# Patient Record
Sex: Female | Born: 1955 | Race: Black or African American | Hispanic: No | Marital: Single | Smoking: Never smoker
Health system: Southern US, Community
[De-identification: ages and names within clinical notes are randomized; demographics above are authoritative.]

## PROBLEM LIST (undated history)

## (undated) DIAGNOSIS — Z8709 Personal history of other diseases of the respiratory system: Secondary | ICD-10-CM

## (undated) DIAGNOSIS — M8580 Other specified disorders of bone density and structure, unspecified site: Secondary | ICD-10-CM

## (undated) DIAGNOSIS — Z8489 Family history of other specified conditions: Secondary | ICD-10-CM

## (undated) DIAGNOSIS — Z8701 Personal history of pneumonia (recurrent): Secondary | ICD-10-CM

## (undated) DIAGNOSIS — I10 Essential (primary) hypertension: Secondary | ICD-10-CM

## (undated) DIAGNOSIS — R112 Nausea with vomiting, unspecified: Secondary | ICD-10-CM

## (undated) DIAGNOSIS — R002 Palpitations: Secondary | ICD-10-CM

## (undated) DIAGNOSIS — Z780 Asymptomatic menopausal state: Secondary | ICD-10-CM

## (undated) DIAGNOSIS — M25519 Pain in unspecified shoulder: Secondary | ICD-10-CM

## (undated) DIAGNOSIS — E119 Type 2 diabetes mellitus without complications: Secondary | ICD-10-CM

## (undated) DIAGNOSIS — J45909 Unspecified asthma, uncomplicated: Secondary | ICD-10-CM

## (undated) DIAGNOSIS — E785 Hyperlipidemia, unspecified: Secondary | ICD-10-CM

## (undated) DIAGNOSIS — R079 Chest pain, unspecified: Secondary | ICD-10-CM

## (undated) DIAGNOSIS — M1990 Unspecified osteoarthritis, unspecified site: Secondary | ICD-10-CM

## (undated) DIAGNOSIS — M549 Dorsalgia, unspecified: Secondary | ICD-10-CM

## (undated) DIAGNOSIS — Z9889 Other specified postprocedural states: Secondary | ICD-10-CM

## (undated) DIAGNOSIS — F419 Anxiety disorder, unspecified: Secondary | ICD-10-CM

## (undated) DIAGNOSIS — J302 Other seasonal allergic rhinitis: Secondary | ICD-10-CM

## (undated) HISTORY — PX: HAMMER TOE SURGERY: SHX385

## (undated) HISTORY — DX: Pain in unspecified shoulder: M25.519

## (undated) HISTORY — DX: Chest pain, unspecified: R07.9

## (undated) HISTORY — DX: Essential (primary) hypertension: I10

## (undated) HISTORY — PX: OTHER SURGICAL HISTORY: SHX169

## (undated) HISTORY — DX: Other specified disorders of bone density and structure, unspecified site: M85.80

## (undated) HISTORY — DX: Dorsalgia, unspecified: M54.9

## (undated) HISTORY — DX: Type 2 diabetes mellitus without complications: E11.9

## (undated) HISTORY — PX: ROTATOR CUFF REPAIR: SHX139

## (undated) HISTORY — DX: Hyperlipidemia, unspecified: E78.5

## (undated) HISTORY — DX: Asymptomatic menopausal state: Z78.0

## (undated) HISTORY — DX: Anxiety disorder, unspecified: F41.9

---

## 1999-01-23 ENCOUNTER — Other Ambulatory Visit: Admission: RE | Admit: 1999-01-23 | Discharge: 1999-01-23 | Payer: Self-pay | Admitting: Obstetrics and Gynecology

## 1999-02-15 ENCOUNTER — Ambulatory Visit (HOSPITAL_COMMUNITY): Admission: RE | Admit: 1999-02-15 | Discharge: 1999-02-15 | Payer: Self-pay | Admitting: Gastroenterology

## 1999-05-07 HISTORY — PX: ABDOMINAL HYSTERECTOMY: SHX81

## 2000-02-06 ENCOUNTER — Encounter: Payer: Self-pay | Admitting: Emergency Medicine

## 2000-02-06 ENCOUNTER — Encounter: Admission: RE | Admit: 2000-02-06 | Discharge: 2000-02-06 | Payer: Self-pay | Admitting: Emergency Medicine

## 2000-04-08 ENCOUNTER — Other Ambulatory Visit: Admission: RE | Admit: 2000-04-08 | Discharge: 2000-04-08 | Payer: Self-pay | Admitting: Obstetrics and Gynecology

## 2000-06-06 ENCOUNTER — Emergency Department (HOSPITAL_COMMUNITY): Admission: EM | Admit: 2000-06-06 | Discharge: 2000-06-07 | Payer: Self-pay | Admitting: Emergency Medicine

## 2000-08-05 ENCOUNTER — Inpatient Hospital Stay (HOSPITAL_COMMUNITY): Admission: RE | Admit: 2000-08-05 | Discharge: 2000-08-08 | Payer: Self-pay | Admitting: Obstetrics and Gynecology

## 2000-08-05 ENCOUNTER — Encounter (INDEPENDENT_AMBULATORY_CARE_PROVIDER_SITE_OTHER): Payer: Self-pay | Admitting: Specialist

## 2001-04-06 ENCOUNTER — Encounter: Admission: RE | Admit: 2001-04-06 | Discharge: 2001-04-06 | Payer: Self-pay | Admitting: Emergency Medicine

## 2001-04-06 ENCOUNTER — Encounter: Payer: Self-pay | Admitting: Emergency Medicine

## 2001-05-11 ENCOUNTER — Other Ambulatory Visit: Admission: RE | Admit: 2001-05-11 | Discharge: 2001-05-11 | Payer: Self-pay | Admitting: Obstetrics and Gynecology

## 2002-04-22 ENCOUNTER — Encounter: Admission: RE | Admit: 2002-04-22 | Discharge: 2002-04-22 | Payer: Self-pay | Admitting: Emergency Medicine

## 2002-04-22 ENCOUNTER — Encounter: Payer: Self-pay | Admitting: Emergency Medicine

## 2002-05-25 ENCOUNTER — Other Ambulatory Visit: Admission: RE | Admit: 2002-05-25 | Discharge: 2002-05-25 | Payer: Self-pay | Admitting: Obstetrics and Gynecology

## 2002-08-03 ENCOUNTER — Encounter: Admission: RE | Admit: 2002-08-03 | Discharge: 2002-08-03 | Payer: Self-pay | Admitting: Emergency Medicine

## 2002-08-03 ENCOUNTER — Encounter: Payer: Self-pay | Admitting: Emergency Medicine

## 2002-08-09 ENCOUNTER — Ambulatory Visit (HOSPITAL_COMMUNITY): Admission: RE | Admit: 2002-08-09 | Discharge: 2002-08-09 | Payer: Self-pay | Admitting: Obstetrics and Gynecology

## 2003-05-24 ENCOUNTER — Encounter: Admission: RE | Admit: 2003-05-24 | Discharge: 2003-05-24 | Payer: Self-pay | Admitting: Emergency Medicine

## 2003-06-02 ENCOUNTER — Other Ambulatory Visit: Admission: RE | Admit: 2003-06-02 | Discharge: 2003-06-02 | Payer: Self-pay | Admitting: Obstetrics and Gynecology

## 2003-08-11 ENCOUNTER — Encounter: Admission: RE | Admit: 2003-08-11 | Discharge: 2003-08-11 | Payer: Self-pay | Admitting: Emergency Medicine

## 2004-06-13 ENCOUNTER — Encounter: Admission: RE | Admit: 2004-06-13 | Discharge: 2004-06-13 | Payer: Self-pay | Admitting: Emergency Medicine

## 2004-09-18 ENCOUNTER — Encounter: Admission: RE | Admit: 2004-09-18 | Discharge: 2004-09-18 | Payer: Self-pay | Admitting: Emergency Medicine

## 2005-02-01 ENCOUNTER — Other Ambulatory Visit: Admission: RE | Admit: 2005-02-01 | Discharge: 2005-02-01 | Payer: Self-pay | Admitting: Obstetrics and Gynecology

## 2005-08-08 ENCOUNTER — Encounter: Admission: RE | Admit: 2005-08-08 | Discharge: 2005-08-08 | Payer: Self-pay | Admitting: Emergency Medicine

## 2005-08-29 ENCOUNTER — Encounter (INDEPENDENT_AMBULATORY_CARE_PROVIDER_SITE_OTHER): Payer: Self-pay | Admitting: Specialist

## 2005-08-29 ENCOUNTER — Ambulatory Visit (HOSPITAL_BASED_OUTPATIENT_CLINIC_OR_DEPARTMENT_OTHER): Admission: RE | Admit: 2005-08-29 | Discharge: 2005-08-29 | Payer: Self-pay | Admitting: Orthopedic Surgery

## 2006-08-11 ENCOUNTER — Encounter: Admission: RE | Admit: 2006-08-11 | Discharge: 2006-08-11 | Payer: Self-pay | Admitting: Emergency Medicine

## 2007-02-11 ENCOUNTER — Encounter: Admission: RE | Admit: 2007-02-11 | Discharge: 2007-02-11 | Payer: Self-pay | Admitting: Emergency Medicine

## 2007-03-24 ENCOUNTER — Other Ambulatory Visit: Admission: RE | Admit: 2007-03-24 | Discharge: 2007-03-24 | Payer: Self-pay | Admitting: Obstetrics and Gynecology

## 2007-05-07 DIAGNOSIS — M25519 Pain in unspecified shoulder: Secondary | ICD-10-CM

## 2007-05-07 HISTORY — DX: Pain in unspecified shoulder: M25.519

## 2007-07-01 ENCOUNTER — Encounter: Admission: RE | Admit: 2007-07-01 | Discharge: 2007-07-01 | Payer: Self-pay | Admitting: Emergency Medicine

## 2007-07-13 ENCOUNTER — Encounter: Admission: RE | Admit: 2007-07-13 | Discharge: 2007-07-13 | Payer: Self-pay | Admitting: Emergency Medicine

## 2007-09-17 ENCOUNTER — Encounter: Admission: RE | Admit: 2007-09-17 | Discharge: 2007-09-17 | Payer: Self-pay | Admitting: Emergency Medicine

## 2008-02-03 ENCOUNTER — Encounter: Admission: RE | Admit: 2008-02-03 | Discharge: 2008-02-03 | Payer: Self-pay | Admitting: Emergency Medicine

## 2008-09-19 ENCOUNTER — Encounter: Admission: RE | Admit: 2008-09-19 | Discharge: 2008-09-19 | Payer: Self-pay | Admitting: Internal Medicine

## 2009-05-06 DIAGNOSIS — R079 Chest pain, unspecified: Secondary | ICD-10-CM

## 2009-05-06 HISTORY — DX: Chest pain, unspecified: R07.9

## 2009-09-25 ENCOUNTER — Encounter: Admission: RE | Admit: 2009-09-25 | Discharge: 2009-09-25 | Payer: Self-pay | Admitting: Internal Medicine

## 2009-09-29 ENCOUNTER — Encounter: Admission: RE | Admit: 2009-09-29 | Discharge: 2009-09-29 | Payer: Self-pay | Admitting: Internal Medicine

## 2010-04-02 ENCOUNTER — Encounter: Admission: RE | Admit: 2010-04-02 | Discharge: 2010-04-02 | Payer: Self-pay | Admitting: Internal Medicine

## 2010-09-21 NOTE — Op Note (Signed)
Michelle Oneill, Michelle Oneill                ACCOUNT NO.:  [id number]   MEDICAL RECORD NO.:  [id number]          PATIENT TYPE:  AMB   LOCATION:  DSC                          FACILITY:  [facility]   PHYSICIAN:  Cindee Salt, M.D.       DATE OF BIRTH:  Apr 16, 1956   DATE OF PROCEDURE:  08/29/2005  DATE OF DISCHARGE:                                 OPERATIVE REPORT   PREOPERATIVE DIAGNOSIS:  Mucoid cyst, right thumb.   POSTOPERATIVE DIAGNOSIS:  Mucoid cyst, right thumb.   OPERATION:  Excision of mucoid cyst, debridement of interphalangeal joint,  right thumb.   SURGEON:  Cindee Salt, M.D.   ASSISTANT:  Carolyne Fiscal R.N.   ANESTHESIA:  Forearm-based IV regional.   HISTORY:  The patient is a 55 year old female with history of a mass at the  PIP joint of her right thumb indicative of a mucoid cyst.  She is desirous  of removing.  X-rays revealed degenerative changes.  She is aware of risks  and complications.  Preoperatively questions were answered, the thumb marked  by the patient and surgeon.   PROCEDURE:  The patient was brought to the operating room, where a forearm-  based IV regional anesthetic was carried out without difficulty.  She was  prepped using DuraPrep, supine position, right arm free.  A curvilinear  incision made transversely over the IP joint of the thumb, carried down  through subcutaneous tissue.  Bleeders were electrocauterized.  The cyst was  immediately encountered.  With blunt sharp dissection this was dissected  free.  A small dorsal sensory nerve proceeded through the cyst and was  unable to be dissected free due to the amount of scarring.  This was  resected.  The cyst was then removed, the joint opened.  Degenerative  changes were then removed with a small rongeur from both the distal phalanx  and proximal phalanx.  The wound was copiously irrigated with saline.  The  skin was then closed with interrupted 5-0 nylon sutures.  The specimen was  sent to pathology.  A sterile  compressive dressing and splint to the thumb  applied.  The patient tolerated the procedure well and was taken to the  recovery room for observation in satisfactory condition.  She is discharged  home to return to the [facility] [place] in one week on Vicodin.           ______________________________  Cindee Salt, M.D.     GK/MEDQ  D:  08/29/2005  T:  08/30/2005  Job:  [id number]

## 2010-09-21 NOTE — Op Note (Signed)
NAME:  Michelle Oneill, Michelle Oneill                          ACCOUNT NO.:  [id number]   MEDICAL RECORD NO.:  [id number]                   PATIENT TYPE:  AMB   LOCATION:  SDC                                  FACILITY[facility]  [facility]   PHYSICIAN:  James A. Ashley Royalty, M.D.             DATE OF BIRTH:  Mar 29, 1956   DATE OF PROCEDURE:  08/09/2002  DATE OF DISCHARGE:  08/09/2002                                 OPERATIVE REPORT   PREOPERATIVE DIAGNOSES:  1. Pelvic pain.  2. Status post hysterectomy and tubal sterilization procedure.   POSTOPERATIVE DIAGNOSES:  1. Pelvic adhesions.     a. Small bowel to right pelvic side wall.     b. Omentum to anterior abdominal wall.   PROCEDURE:  1. Diagnostic/operative laparoscopy.  2. Lysis of adhesions.   SURGEON:  Rudy Jew. Ashley Royalty, M.D.   ASSISTANT:  Velora Mediate, OB/GYN N.P.   ANESTHESIA:  General.   ESTIMATED BLOOD LOSS:  Less than 25 mL.   COMPLICATIONS:  None.   PACKS AND DRAINS:  None.   PROCEDURE:  The patient was taken to the operating room and placed in the  dorsal supine position.  After adequate general anesthesia was administered  she was placed in the lithotomy position and prepped and draped in the usual  manner for abdominal and vaginal surgery.  The bladder was drained with a  red rubber catheter.  A 1.5 cm infraumbilical incision was made in the  transverse plane.  A size 10-11 disposable laparoscopic trocar was then  placed in the abdominal cavity.  Its location was verified by placement of  the laparoscope.  There was no evidence of any trauma.  CO2 was used to  create a pneumoperitoneum which was maintained throughout the procedure.  Next, 5 mm suprapubic trocars were placed in the left and right lower  quadrants, respectively.  Transillumination, direct visualization techniques  were employed.  The pelvis was then thoroughly surveyed.  The uterus was  noted to be surgically absent.  The left ovary was adherent to the left  pelvic side  wall.  The left fallopian tube was not visualized.  It was noted  that the patient underwent a bipolar cautery on that tube to accomplish  sterilization previously.  The right fallopian tube and ovary were adherent  to the right pelvic side wall.  It was noted that the sterilization  procedure performed on the right fallopian tube was a Falope ring instead of  bipolar cautery, thus explaining the larger presence of a right fallopian  tube as opposed to the essential non-existence of the left fallopian tube.  Additional survey of the pelvis revealed numerous loops of small bowel  adherent to the right pelvic side wall.  In addition, the omentum was noted  to be adherent to the anterior abdominal wall.  Some of the adhesions were  in close proximity to the umbilical trocar site.  Careful  examination of  these adhesions including through a 5 mm camera through the inferior port  reveal no evidence of any injury or bowel adherent to the anterior abdominal  wall.  Appropriate photos were obtained.   Using the laser at 14 watts power with GRP-6 tip the adhesions from the  small bowel to the right aspect of the pelvic side wall were released.  Appropriate photos were obtained.  Next, an attempt was made to release as  many of the omental adhesions from the anterior abdominal wall as possible.  In order to accomplish this the tripolar cautery was employed and careful  attention to avoiding injury to the small bowel.  In the final analysis  some, but not all of the adhesions of the omentum to the anterior abdominal  wall were lysed.  The remainder were felt to be too difficult to accomplish  laparoscopically and would undoubtedly require laparotomy.  Since the  patient's pain was noted in her pelvis it was unclear whether she would  benefit at all from further lysis of adhesions and therefore no further  attempts were made.   At this point the patient was felt to have benefitted maximally from  the  surgical procedure.  The abdominal instruments were then removed and  pneumoperitoneum evacuated.  Fascial defects were closed with 0 Vicryl in an  interrupted fashion.  The skin was closed with 3-0 chromic or equivalent in  a subcuticular fashion.  Approximately 10 mL of 0.25% bupivacaine was  instilled into the incisions to aid in postoperative analgesia.   The patient tolerated the procedure extremely well and was returned to the  recovery room in good condition.                                               James A. Ashley Royalty, M.D.    JAM/MEDQ  D:  08/11/2002  T:  08/11/2002  Job:  [id number]

## 2010-09-21 NOTE — H&P (Signed)
NAME:  Michelle Oneill, Michelle Oneill                          ACCOUNT NO.:  [id number]   MEDICAL RECORD NO.:  [id number]                   PATIENT TYPE:  AMB   LOCATION:  SDC                                  [facility]ILITY[facility]   PHYSICIAN:  James A. Ashley Royalty, M.D.             DATE OF BIRTH:  [birth year]   DATE OF ADMISSION:  08/09/2002  DATE OF DISCHARGE:                                HISTORY & PHYSICAL   HISTORY OF PRESENT ILLNESS:  The patient is a 55 year old gravida 4, para 2,  AB 2 who presented to me 07/01/2002 complaining of left lower quadrant  discomfort, intermittent, lasting 15-30 minutes at a time.  At times it is  debilitating enough to require her to stay in bed.  Ultrasound was performed  07/15/2002 which revealed two cysts on the left adnexa, the greater of which  was 1.6 cm in greatest diameter.  The right adnexa was unremarkable.  The  uterus is surgically absent.  She was offered GI consultation prior to any  surgical intervention.  However, instead, she stated her symptoms were  sufficiently debilitating to warrant surgical intervention and preferred to  proceed with laparoscopy instead.   MEDICATIONS:  Rhinocort, albuterol, Biaxin, alprazolam.   PAST MEDICAL HISTORY:  1. Bronchitis.  2. Hypercholesterolemia for which she sees Dr. Lorenz Coaster.   PAST SURGICAL HISTORY:  Status post C-section x2, laparoscopic bilateral  tubal sterilization procedure, total abdominal hysterectomy.   ALLERGIES:  ZYRTEC.   FAMILY HISTORY:  Family history is positive for colon cancer and diabetes.   SOCIAL HISTORY:  The patient denies use of tobacco or consuming alcohol.   REVIEW OF SYSTEMS:  Noncontributory.   PHYSICAL EXAMINATION:  GENERAL:  Well-developed, well-nourished pleasant  black female in no acute distress.  VITAL SIGNS:  Afebrile, vital signs stable.  SKIN:  Warm and dry without lesions.  LYMPHATICS:  There is no supraclavicular, cervical, or inguinal adenopathy.  HEENT:   Normocephalic.  NECK:  Supple without thyromegaly.  CHEST:  Lungs are clear.  CARDIAC:  Regular rate and rhythm without murmurs, gallops, or rubs.  BREAST:  Exam deferred.  ABDOMEN:  Soft and nontender without masses or organomegaly.  Bowel sounds  are active.  MUSCULOSKELETAL:  Full range of motion without edema, cyanosis, or CVA  tenderness.  PELVIC:  Deferred until examination under anesthesia.    IMPRESSION:  1. Lower abdominal discomfort - left greater than right - etiology     uncertain.  Differential includes adhesions, endometriosis, primary     gastrointestinal, etc.  2. Status post total abdominal hysterectomy.  3. Hypercholesterolemia.  4. Status post laparoscopic bilateral tubal sterilization procedure.  5. Left adnexal cyst versus cystic follicle.   PLAN:  Diagnostic/operative laparoscopy.  Risks, benefits, complications,  and alternatives fully discussed with the patient.  Possibility of  unilateral/bilateral salpingo-oophorectomy was discussed and accepted.  Possibility of exploratory laparotomy discussed and accepted.  Questions  invited/answered.                                               James A. Ashley Royalty, M.D.    JAM/MEDQ  D:  08/08/2002  T:  08/09/2002  Job:  [id number]

## 2010-09-21 NOTE — Discharge Summary (Signed)
[facility]  Patient:    Michelle Oneill, Michelle Oneill                       MRN: [id number] Adm. Date:  [id number] Disc. Date: [id number] Attending:  Wandalee Ferdinand                           Discharge Summary  DISCHARGE DIAGNOSES:          1. Fibroid uterus.                               2. Benign proliferative endometrium.  DISCHARGE MEDICATIONS:        Analgesics, iron.  HISTORY AND PHYSICAL:         This is a 55 year old gravida 4, para 2, AB2 for hysterectomy pursuant to the history of a symptomatic fibroid uterus with lower abdominal discomfort, dysmenorrhea, dyspareunia.  HOSPITAL COURSE:              Patient was admitted to [facility] [place].  On August 07, 2000 she was taken to the operating room and underwent total abdominal hysterectomy per Dr. Sylvester Harder.  There were no intraoperative complications.  Patients postoperative course was benign.  She was discharged home on the third postoperative day afebrile and in satisfactory condition.  LABORATORIES:                 Hemoglobin and hematocrit on admission were 9.1 and 25.8 respectively.  Repeat values were obtained throughout the hospitalization the last of which were obtained August 08, 2000 and revealed values of 9.8 and 27.9 respectively.  Routine chemistries were within normal limits.  Serum pregnancy test was negative.  Urinalysis was negative.  Type and Rh were obtained, but equivocal in the chart.  DISPOSITION:                  Patient is to return to [facility] in four to six weeks for postoperative evaluation. DD:  08/25/00 TD:  08/25/00 Job: [id number]

## 2010-09-21 NOTE — Op Note (Signed)
[facility]  Patient:    Michelle Oneill, Michelle Oneill                         MRN: [id number] Proc. Date: 08/05/00 Attending:  Fayrene Fearing A. Ashley Royalty, M.D.                           Operative Report  PREOPERATIVE DIAGNOSES:       1. Fibroid uterus.                               2. Lower abdominal discomfort, dysmenorrhea,                                  and dyspareunia - unresponsive to medical                                  management.  POSTOPERATIVE DIAGNOSES:      1. Fibroid uterus.                               2. Lower abdominal discomfort, dysmenorrhea,                                  and dyspareunia - unresponsive to medical                                  management, pathology pending.  OPERATION:                    Total abdominal hysterectomy.  SURGEON:                      Rudy Jew. Ashley Royalty, M.D.  ASSISTANT:                    Georgina Peer, M.D.  ANESTHESIA:                   General.  ESTIMATED BLOOD LOSS:         300 cc.  COMPLICATIONS:                None.  PACKS AND DRAINS:             Foley.  Sponge, needle, and instrument count                               was reported as correct x 2.  DESCRIPTION OF PROCEDURE:     The patient was taken to the operating room and placed in the dorsal supine position.  After adequate general anesthesia was administered, she was prepped and draped in the usual manner for abdominal surgery.  The vagina was prepped as well.  A Foley catheter was placed.  A Pfannenstiel incision was made down to the level of the fascia.  The old skin incision from her C-section was utilized for this purpose.  The fascia was opened with sharp dissection and the incision extended transversely.  The rectus  muscles were separated from the overlying fascia using sharp and blunt dissection.  The patient was noted to be quite scarred, apparently from her previous abdominal surgery.  The peritoneum was elevated and  entered atraumatically with Metzenbaum scissors.  The incision was extended longitudinally.  There was a single omental adhesion to the anterior abdominal wall which was divided and secured with free ties of #1 chromic catgut. Hemostasis was noted.  The peritoneal incision was extended longitudinally, paying careful attention to avoid injury to the urinary tract.  The upper abdomen was then explored.  There were no additional adhesions noted.  The peritoneal surfaces were smooth and glistening.  The liver surface is smooth. The kidneys were present bilaterally.  There was no periaortic adenopathy.  A Balfour retractor was placed.  The upper abdomen was packed off.  The round ligaments were then clamped, cut, and secured with #1 chromic catgut.  A bladder flap was created by incising the anterior uterine serosa, and sharply and bluntly dissecting the bladder inferiorly.  An approximately 4 cm fibroid was noted arising from the anterolateral aspect of the uterus on the left side.  The utero-ovarian ligament, round ligament, and utero-ovarian anastomoses were clamped with a Ligasure device and coagulated.  They were then divided.  Hemostasis was noted.  The uterine vessels were skeletonized and controlled with a Ligasure and cut accordingly.  The cardinal ligaments were clamped and secured with a Ligasure as well and then cut.  The uterosacral ligaments were taken with conventional Heaney clamps, cut, and secured with #1 chromic catgut holding the pedicles. The vagina was entered anteriorly and the cervix incised with Satinsky scissors.  Richardson angled sutures were placed at 3 and 9 oclock in the vaginal angles.  Hemostasis was noted.  Next, a running locking circumferential suture of 2-0 Vicryl was used to obtain hemostasis over the vaginal cuff.  Hemostasis was noted.  One additional suture was placed in the midline sagittally in the vaginal cuff of 0 chromic in order to avoid  herniation.  Hemostasis was noted.  All pedicles were inspected.  The ureters were present well below the plane of dissection. Copious irrigation was accomplished.  At this point, all packs were removed as well as the retractor.  The fascia was then closed with 0 Vicryl in a running fashion.  The skin was closed with staples.  The patient tolerated the procedure extremely well, and was returned to the recovery room in good condition.  At the conclusion of the procedure, the urine was clear and copious. DD:  08/05/00 TD:  08/05/00 Job: [id number]

## 2010-09-24 ENCOUNTER — Other Ambulatory Visit: Payer: Self-pay | Admitting: Internal Medicine

## 2010-09-24 DIAGNOSIS — R921 Mammographic calcification found on diagnostic imaging of breast: Secondary | ICD-10-CM

## 2010-10-08 ENCOUNTER — Ambulatory Visit
Admission: RE | Admit: 2010-10-08 | Discharge: 2010-10-08 | Disposition: A | Discharge status: Home / Self Care | Payer: 59 | Source: Ambulatory Visit | Attending: Internal Medicine | Admitting: Internal Medicine

## 2010-10-08 DIAGNOSIS — R921 Mammographic calcification found on diagnostic imaging of breast: Secondary | ICD-10-CM

## 2011-07-03 ENCOUNTER — Other Ambulatory Visit: Payer: Self-pay | Admitting: Internal Medicine

## 2011-07-03 DIAGNOSIS — R51 Headache: Secondary | ICD-10-CM

## 2011-07-05 ENCOUNTER — Ambulatory Visit
Admission: RE | Admit: 2011-07-05 | Discharge: 2011-07-05 | Disposition: A | Discharge status: Home / Self Care | Payer: 59 | Source: Ambulatory Visit | Attending: Internal Medicine | Admitting: Internal Medicine

## 2011-07-05 DIAGNOSIS — R51 Headache: Secondary | ICD-10-CM

## 2011-09-24 ENCOUNTER — Other Ambulatory Visit: Payer: Self-pay | Admitting: Internal Medicine

## 2011-09-24 DIAGNOSIS — R921 Mammographic calcification found on diagnostic imaging of breast: Secondary | ICD-10-CM

## 2011-10-18 ENCOUNTER — Ambulatory Visit
Admission: RE | Admit: 2011-10-18 | Discharge: 2011-10-18 | Disposition: A | Discharge status: Home / Self Care | Payer: 59 | Source: Ambulatory Visit | Attending: Internal Medicine | Admitting: Internal Medicine

## 2011-10-18 DIAGNOSIS — R921 Mammographic calcification found on diagnostic imaging of breast: Secondary | ICD-10-CM

## 2012-11-04 ENCOUNTER — Other Ambulatory Visit: Payer: Self-pay

## 2012-11-04 DIAGNOSIS — Z1231 Encounter for screening mammogram for malignant neoplasm of breast: Secondary | ICD-10-CM

## 2012-11-24 ENCOUNTER — Ambulatory Visit
Admission: RE | Admit: 2012-11-24 | Discharge: 2012-11-24 | Disposition: A | Discharge status: Home / Self Care | Payer: 59 | Source: Ambulatory Visit

## 2012-11-24 DIAGNOSIS — Z1231 Encounter for screening mammogram for malignant neoplasm of breast: Secondary | ICD-10-CM

## 2012-11-26 ENCOUNTER — Other Ambulatory Visit: Payer: Self-pay | Admitting: Internal Medicine

## 2012-11-26 DIAGNOSIS — R928 Other abnormal and inconclusive findings on diagnostic imaging of breast: Secondary | ICD-10-CM

## 2012-12-25 ENCOUNTER — Ambulatory Visit
Admission: RE | Admit: 2012-12-25 | Discharge: 2012-12-25 | Disposition: A | Discharge status: Home / Self Care | Payer: 59 | Source: Ambulatory Visit | Attending: Internal Medicine | Admitting: Internal Medicine

## 2012-12-25 ENCOUNTER — Other Ambulatory Visit: Payer: Self-pay | Admitting: Internal Medicine

## 2012-12-25 DIAGNOSIS — R928 Other abnormal and inconclusive findings on diagnostic imaging of breast: Secondary | ICD-10-CM

## 2013-01-18 ENCOUNTER — Ambulatory Visit
Admission: RE | Admit: 2013-01-18 | Discharge: 2013-01-18 | Disposition: A | Discharge status: Home / Self Care | Payer: 59 | Source: Ambulatory Visit | Attending: Internal Medicine | Admitting: Internal Medicine

## 2013-01-18 DIAGNOSIS — R928 Other abnormal and inconclusive findings on diagnostic imaging of breast: Secondary | ICD-10-CM

## 2013-11-29 ENCOUNTER — Other Ambulatory Visit: Payer: Self-pay

## 2013-11-29 DIAGNOSIS — Z1231 Encounter for screening mammogram for malignant neoplasm of breast: Secondary | ICD-10-CM

## 2013-12-09 ENCOUNTER — Ambulatory Visit
Admission: RE | Admit: 2013-12-09 | Discharge: 2013-12-09 | Disposition: A | Discharge status: Home / Self Care | Payer: 59 | Source: Ambulatory Visit

## 2013-12-09 DIAGNOSIS — Z1231 Encounter for screening mammogram for malignant neoplasm of breast: Secondary | ICD-10-CM

## 2013-12-15 ENCOUNTER — Encounter: Payer: Self-pay | Admitting: *Deleted

## 2015-01-30 ENCOUNTER — Other Ambulatory Visit: Payer: Self-pay

## 2015-01-30 DIAGNOSIS — Z1231 Encounter for screening mammogram for malignant neoplasm of breast: Secondary | ICD-10-CM

## 2015-02-07 ENCOUNTER — Ambulatory Visit
Admission: RE | Admit: 2015-02-07 | Discharge: 2015-02-07 | Disposition: A | Discharge status: Home / Self Care | Payer: Commercial Managed Care - HMO | Source: Ambulatory Visit

## 2015-02-07 DIAGNOSIS — Z1231 Encounter for screening mammogram for malignant neoplasm of breast: Secondary | ICD-10-CM

## 2015-02-28 ENCOUNTER — Other Ambulatory Visit: Payer: Self-pay | Admitting: Orthopedic Surgery

## 2015-03-09 NOTE — Pre-Procedure Instructions (Signed)
    [facility]urdV[facility]  03/09/2015      [id number] [place], [place] - [address]  [place] [id number] Phone: [phone] Fax: [phone]    Your procedure is scheduled on Thursday, November 10th, 2016.  [facility]00 [facility].M.  Call this number if you have problems the morning of surgery:  [phone]   Remember:  Do not eat food or drink liquids after midnight.   Take these medicines the morning of surgery with A SIP OF WATER: Alprazolam (Xanax) if needed, Tramadol (Ultram) if needed.  Stop taking: Aspirin, NSAIDS, Aleve, Naproxen, Ibuprofen, Advil, Motrin, BC's, Goody's, Fish oil, all herbal medications, and all vitamins.    Do not wear jewelry, make-up or nail polish.  Do not wear lotions, powders, or perfumes.  You may NOT wear deodorant.  Do not shave 48 hours prior to surgery.    Do not bring valuables to the hospital.  [facility] is not responsible for any belongings or valuables.  Contacts, dentures or bridgework may not be worn into surgery.  Leave your suitcase in the car.  After surgery it may be brought to your room.  For patients admitted to the hospital, discharge time will be determined by your treatment team.  Patients discharged the day of surgery will not be allowed to drive home.   Special instructions:  See attached.   Please read over the following fact sheets that you were given. Pain Booklet, Coughing and Deep Breathing, MRSA Information and Surgical Site Infection Prevention

## 2015-03-10 ENCOUNTER — Encounter (HOSPITAL_COMMUNITY)
Admission: RE | Admit: 2015-03-10 | Discharge: 2015-03-10 | Disposition: A | Discharge status: Home / Self Care | Payer: Commercial Managed Care - HMO | Source: Ambulatory Visit | Attending: Orthopedic Surgery | Admitting: Orthopedic Surgery

## 2015-03-10 ENCOUNTER — Encounter (HOSPITAL_COMMUNITY): Payer: Self-pay

## 2015-03-10 DIAGNOSIS — R9431 Abnormal electrocardiogram [ECG] [EKG]: Secondary | ICD-10-CM | POA: Diagnosis not present

## 2015-03-10 DIAGNOSIS — Z01818 Encounter for other preprocedural examination: Secondary | ICD-10-CM | POA: Diagnosis not present

## 2015-03-10 DIAGNOSIS — M5410 Radiculopathy, site unspecified: Secondary | ICD-10-CM | POA: Diagnosis not present

## 2015-03-10 DIAGNOSIS — Z01812 Encounter for preprocedural laboratory examination: Secondary | ICD-10-CM | POA: Insufficient documentation

## 2015-03-10 DIAGNOSIS — J45909 Unspecified asthma, uncomplicated: Secondary | ICD-10-CM | POA: Insufficient documentation

## 2015-03-10 DIAGNOSIS — Z0183 Encounter for blood typing: Secondary | ICD-10-CM | POA: Diagnosis not present

## 2015-03-10 DIAGNOSIS — I10 Essential (primary) hypertension: Secondary | ICD-10-CM | POA: Insufficient documentation

## 2015-03-10 HISTORY — DX: Unspecified osteoarthritis, unspecified site: M19.90

## 2015-03-10 HISTORY — DX: Other seasonal allergic rhinitis: J30.2

## 2015-03-10 HISTORY — DX: Unspecified asthma, uncomplicated: J45.909

## 2015-03-10 HISTORY — DX: Nausea with vomiting, unspecified: Z98.890

## 2015-03-10 HISTORY — DX: Palpitations: R00.2

## 2015-03-10 HISTORY — DX: Personal history of pneumonia (recurrent): Z87.01

## 2015-03-10 HISTORY — DX: Personal history of other diseases of the respiratory system: Z87.09

## 2015-03-10 HISTORY — DX: Family history of other specified conditions: Z84.89

## 2015-03-10 HISTORY — DX: Other specified postprocedural states: R11.2

## 2015-03-10 LAB — CBC WITH DIFFERENTIAL/PLATELET
Basophils Absolute: 0 10*3/uL (ref 0.0–0.1)
Basophils Relative: 0 %
EOS ABS: 0.1 10*3/uL (ref 0.0–0.7)
Eosinophils Relative: 2 %
HEMATOCRIT: 37.9 % (ref 36.0–46.0)
HEMOGLOBIN: 13.1 g/dL (ref 12.0–15.0)
Lymphocytes Relative: 46 %
Lymphs Abs: 2.9 10*3/uL (ref 0.7–4.0)
MCH: 29.6 pg (ref 26.0–34.0)
MCHC: 34.6 g/dL (ref 30.0–36.0)
MCV: 85.6 fL (ref 78.0–100.0)
Monocytes Absolute: 0.4 10*3/uL (ref 0.1–1.0)
Monocytes Relative: 7 %
NEUTROS PCT: 45 %
Neutro Abs: 2.7 10*3/uL (ref 1.7–7.7)
Platelets: 357 10*3/uL (ref 150–400)
RBC: 4.43 MIL/uL (ref 3.87–5.11)
RDW: 12.6 % (ref 11.5–15.5)
WBC: 6.1 10*3/uL (ref 4.0–10.5)

## 2015-03-10 LAB — URINALYSIS, ROUTINE W REFLEX MICROSCOPIC
Bilirubin Urine: NEGATIVE
GLUCOSE, UA: NEGATIVE mg/dL
Hgb urine dipstick: NEGATIVE
Ketones, ur: NEGATIVE mg/dL
LEUKOCYTES UA: NEGATIVE
Nitrite: NEGATIVE
PH: 6 (ref 5.0–8.0)
Protein, ur: NEGATIVE mg/dL
SPECIFIC GRAVITY, URINE: 1.013 (ref 1.005–1.030)
Urobilinogen, UA: 0.2 mg/dL (ref 0.0–1.0)

## 2015-03-10 LAB — COMPREHENSIVE METABOLIC PANEL
ALBUMIN: 4.4 g/dL (ref 3.5–5.0)
ALK PHOS: 51 U/L (ref 38–126)
ALT: 34 U/L (ref 14–54)
AST: 43 U/L — ABNORMAL HIGH (ref 15–41)
Anion gap: 10 (ref 5–15)
BILIRUBIN TOTAL: 0.5 mg/dL (ref 0.3–1.2)
BUN: 7 mg/dL (ref 6–20)
CALCIUM: 10 mg/dL (ref 8.9–10.3)
CO2: 25 mmol/L (ref 22–32)
CREATININE: 0.7 mg/dL (ref 0.44–1.00)
Chloride: 103 mmol/L (ref 101–111)
GFR calc Af Amer: >60 mL/min (ref >60)
GFR calc non Af Amer: >60 mL/min (ref >60)
GLUCOSE: 147 mg/dL — AB (ref 65–99)
Potassium: 4.1 mmol/L (ref 3.5–5.1)
SODIUM: 138 mmol/L (ref 135–145)
TOTAL PROTEIN: 7.8 g/dL (ref 6.5–8.1)

## 2015-03-10 LAB — APTT: aPTT: 28 seconds (ref 24–37)

## 2015-03-10 LAB — TYPE AND SCREEN
ABO/RH(D): B POS
ANTIBODY SCREEN: NEGATIVE

## 2015-03-10 LAB — ABO/RH: ABO/RH(D): B POS

## 2015-03-10 LAB — SURGICAL PCR SCREEN
MRSA, PCR: NEGATIVE
Staphylococcus aureus: NEGATIVE

## 2015-03-10 LAB — PROTIME-INR
INR: 1 (ref 0.00–1.49)
Prothrombin Time: 13.4 seconds (ref 11.6–15.2)

## 2015-03-10 NOTE — Progress Notes (Signed)
PCP - Dr. Georgann HousekeeperKarrar Husain Cardiologist - denies  EKG - 03/10/15 - Epic CXR- 03/10/15 - Epic  Echo - greater than 10 years ago Stress test - 2013 - requested from Dr. Donette LarryHusain Cardiac Cath - denies  Patient denies shortness of breath and chest pain at PAT appointment.  Patient states that she has been told she is "pre-diabetic" and that it is controlled with diet and exercise.

## 2015-03-11 LAB — HEMOGLOBIN A1C
Hgb A1c MFr Bld: 7.3 % — ABNORMAL HIGH (ref 4.8–5.6)
MEAN PLASMA GLUCOSE: 163 mg/dL

## 2015-03-14 NOTE — H&P (Signed)
PREOPERATIVE H&P  Chief Complaint: L > R leg pain  HPI: Michelle Oneill is a 59 y.o. 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 Dyslipidemia   . Anxiety   . Shoulder pain 2009    s/p surgery  . Chest pain 2011    stress test neg  . HTN (hypertension)   . Postmenopausal   . Osteopenia   . Back pain   . PONV (postoperative nausea and vomiting)     "very sick"  . Family history of adverse reaction to anesthesia     "sisters also get nauseated"   . Seasonal allergies   . DM (diabetes mellitus) (HCC)     "diet controlled"  . Heart palpitations   . Asthma   . History of pneumonia   . History of bronchitis   . Arthritis    Past Surgical History  Procedure Laterality Date  . Cesarean section      x2  . Bilateral bunionectomy    . Abdominal hysterectomy  2001    due to fibroids  . Menorrhagia    . Ezema    . Postmenopausal syndrome    . Osteopenia    . Back pain    . Rotator cuff repair Left   . Hammer toe surgery Right    Social History   Social History  . Marital Status: Single    Spouse Name: N/A  . Number of Children: N/A  . Years of Education: N/A   Social History Main Topics  . Smoking status: Never Smoker   . Smokeless tobacco: Not on file  . Alcohol Use: No  . Drug Use: No  . Sexual Activity: Not on file   Other Topics Concern  . Not on file   Social History Narrative   Family History  Problem Relation Age of Onset  . Diabetes Mother   . Cancer - Colon Mother   . Cancer - Ovarian Mother   . Hyperlipidemia Father   . Diabetes Father   . Coronary artery disease Father   . Cancer Paternal Grandmother     breast   Allergies    Allergen Reactions  . Lotrel [Amlodipine Besy-Benazepril Hcl] Other (See Comments)    Scalp, hair problem    Prior to Admission medications   Medication Sig Start Date End Date Taking? Authorizing Provider  ALPRAZolam Prudy Feeler) 0.5 MG tablet Take 0.5 mg by mouth daily as needed for anxiety.    Yes Historical Provider, MD  Ascorbic Acid (VITAMIN C) 1000 MG tablet Take 1,000 mg by mouth at bedtime.   Yes Historical Provider, MD  Calcium Carb-Cholecalciferol (CALCIUM 500+D3) 500-400 MG-UNIT TABS Take 1 tablet by mouth at bedtime.   Yes Historical Provider, MD  estradiol (ESTRACE) 1 MG tablet Take 1 mg by mouth daily with lunch. 02/11/15  Yes Historical Provider, MD  Pseudoephedrine-Guaifenesin (MUCINEX D PO) Take 1 tablet by mouth daily as needed (congestion).   Yes Historical Provider, MD  rosuvastatin (CRESTOR) 10 MG tablet Take 10 mg by mouth at bedtime.    Yes Historical Provider, MD  sertraline (ZOLOFT) 50 MG tablet Take 50 mg by mouth at bedtime.    Yes Historical Provider,  MD  traMADol (ULTRAM) 50 MG tablet Take 50 mg by mouth 2 (two) times daily as needed. 02/23/15  Yes Historical Provider, MD  triamcinolone cream (KENALOG) 0.1 % Apply 1 application topically 2 (two) times daily as needed (eczema).   Yes Historical Provider, MD  valsartan (DIOVAN) 160 MG tablet Take 160 mg by mouth daily.   Yes Historical Provider, MD  vitamin E 400 UNIT capsule Take 400 Units by mouth at bedtime.   Yes Historical Provider, MD  aspirin EC 81 MG tablet Take 81 mg by mouth daily.    Historical Provider, MD  meloxicam (MOBIC) 15 MG tablet Take 15 mg by mouth daily.    Historical Provider, MD  Omega-3 Fatty Acids (FISH OIL) 1200 MG CAPS Take 1,200 mg by mouth 2 (two) times daily.    Historical Provider, MD     All other systems have been reviewed and were otherwise negative with the exception of those mentioned in the HPI and as above.  Physical Exam: There were no vitals filed for this visit.  General:  Alert, no acute distress Cardiovascular: No pedal edema Respiratory: No cyanosis, no use of accessory musculature Skin: No lesions in the area of chief complaint Neurologic: Sensation intact distally Psychiatric: Patient is competent for consent with normal mood and affect Lymphatic: No axillary or cervical lymphadenopathy  MUSCULOSKELETAL: + bilateral SLR  Assessment/Plan: Radiculopathy Plan for Procedure(s): ANTERIOR LATERAL LUMBAR FUSION 1 LEVEL POSTERIOR LUMBAR FUSION 1 LEVEL   Emilee HeroUMONSKI,Macallister Ashmead LEONARD, MD 03/14/2015 7:45 AM

## 2015-03-16 ENCOUNTER — Inpatient Hospital Stay (HOSPITAL_COMMUNITY): Payer: Commercial Managed Care - HMO

## 2015-03-16 ENCOUNTER — Inpatient Hospital Stay (HOSPITAL_COMMUNITY): Payer: Commercial Managed Care - HMO | Admitting: Anesthesiology

## 2015-03-16 ENCOUNTER — Encounter (HOSPITAL_COMMUNITY): Payer: Self-pay | Admitting: *Deleted

## 2015-03-16 ENCOUNTER — Inpatient Hospital Stay (HOSPITAL_COMMUNITY)
Admission: RE | Admit: 2015-03-16 | Discharge: 2015-03-17 | DRG: 455 | Disposition: A | Discharge status: Home / Self Care | Payer: Commercial Managed Care - HMO | Source: Ambulatory Visit | Attending: Orthopedic Surgery | Admitting: Orthopedic Surgery

## 2015-03-16 ENCOUNTER — Encounter (HOSPITAL_COMMUNITY)
Admission: RE | Disposition: A | Discharge status: Home / Self Care | Payer: Self-pay | Source: Ambulatory Visit | Attending: Orthopedic Surgery

## 2015-03-16 DIAGNOSIS — M1990 Unspecified osteoarthritis, unspecified site: Secondary | ICD-10-CM | POA: Diagnosis present

## 2015-03-16 DIAGNOSIS — Z7982 Long term (current) use of aspirin: Secondary | ICD-10-CM

## 2015-03-16 DIAGNOSIS — E119 Type 2 diabetes mellitus without complications: Secondary | ICD-10-CM | POA: Diagnosis present

## 2015-03-16 DIAGNOSIS — J45909 Unspecified asthma, uncomplicated: Secondary | ICD-10-CM | POA: Diagnosis present

## 2015-03-16 DIAGNOSIS — M79604 Pain in right leg: Secondary | ICD-10-CM | POA: Diagnosis present

## 2015-03-16 DIAGNOSIS — I10 Essential (primary) hypertension: Secondary | ICD-10-CM | POA: Diagnosis present

## 2015-03-16 DIAGNOSIS — F419 Anxiety disorder, unspecified: Secondary | ICD-10-CM | POA: Diagnosis present

## 2015-03-16 DIAGNOSIS — M4806 Spinal stenosis, lumbar region: Secondary | ICD-10-CM | POA: Diagnosis present

## 2015-03-16 DIAGNOSIS — Z79899 Other long term (current) drug therapy: Secondary | ICD-10-CM | POA: Diagnosis not present

## 2015-03-16 DIAGNOSIS — M4316 Spondylolisthesis, lumbar region: Secondary | ICD-10-CM | POA: Diagnosis present

## 2015-03-16 DIAGNOSIS — M5410 Radiculopathy, site unspecified: Secondary | ICD-10-CM | POA: Diagnosis present

## 2015-03-16 DIAGNOSIS — E785 Hyperlipidemia, unspecified: Secondary | ICD-10-CM | POA: Diagnosis present

## 2015-03-16 DIAGNOSIS — Z419 Encounter for procedure for purposes other than remedying health state, unspecified: Secondary | ICD-10-CM

## 2015-03-16 HISTORY — PX: ANTERIOR LAT LUMBAR FUSION: SHX1168

## 2015-03-16 LAB — GLUCOSE, CAPILLARY
GLUCOSE-CAPILLARY: 160 mg/dL — AB (ref 65–99)
GLUCOSE-CAPILLARY: 135 mg/dL — AB (ref 65–99)
Glucose-Capillary: 150 mg/dL — ABNORMAL HIGH (ref 65–99)
Glucose-Capillary: 137 mg/dL — ABNORMAL HIGH (ref 65–99)

## 2015-03-16 SURGERY — ANTERIOR LATERAL LUMBAR FUSION 1 LEVEL
Anesthesia: General

## 2015-03-16 MED ORDER — POVIDONE-IODINE 7.5 % EX SOLN: Freq: Once | CUTANEOUS | Status: DC

## 2015-03-16 MED ORDER — ALUM & MAG HYDROXIDE-SIMETH 200-200-20 MG/5ML PO SUSP: 30.0000 mL | Freq: Four times a day (QID) | ORAL | Status: DC | PRN

## 2015-03-16 MED ORDER — HYDROMORPHONE HCL 1 MG/ML IJ SOLN
0.2500 mg | INTRAMUSCULAR | Status: DC | PRN
  Administered 2015-03-16 (×3): 0.5 mg via INTRAVENOUS

## 2015-03-16 MED ORDER — DIAZEPAM 5 MG PO TABS
5.0000 mg | ORAL_TABLET | Freq: Four times a day (QID) | ORAL | Status: DC | PRN
  Administered 2015-03-16 – 2015-03-17 (×2): 5 mg via ORAL
  Filled 2015-03-16 (×2): qty 1

## 2015-03-16 MED ORDER — FENTANYL CITRATE (PF) 100 MCG/2ML IJ SOLN
INTRAMUSCULAR | Status: DC | PRN
  Administered 2015-03-16 (×3): 50 ug via INTRAVENOUS
  Administered 2015-03-16: 200 ug via INTRAVENOUS
  Administered 2015-03-16 (×3): 50 ug via INTRAVENOUS

## 2015-03-16 MED ORDER — ESTRADIOL 1 MG PO TABS
1.0000 mg | ORAL_TABLET | Freq: Every day | ORAL | Status: DC
  Administered 2015-03-17: 1 mg via ORAL
  Filled 2015-03-16: qty 1

## 2015-03-16 MED ORDER — ZOLPIDEM TARTRATE 5 MG PO TABS: 5.0000 mg | ORAL_TABLET | Freq: Every evening | ORAL | Status: DC | PRN

## 2015-03-16 MED ORDER — MIDAZOLAM HCL 5 MG/5ML IJ SOLN
INTRAMUSCULAR | Status: DC | PRN
  Administered 2015-03-16: 2 mg via INTRAVENOUS

## 2015-03-16 MED ORDER — ROSUVASTATIN CALCIUM 20 MG PO TABS
10.0000 mg | ORAL_TABLET | Freq: Every day | ORAL | Status: DC
  Administered 2015-03-16: 10 mg via ORAL
  Filled 2015-03-16: qty 1

## 2015-03-16 MED ORDER — FENTANYL CITRATE (PF) 250 MCG/5ML IJ SOLN
INTRAMUSCULAR | Status: AC
  Filled 2015-03-16: qty 5

## 2015-03-16 MED ORDER — VITAMIN E 180 MG (400 UNIT) PO CAPS: 400.0000 [IU] | ORAL_CAPSULE | Freq: Every day | ORAL | Status: DC

## 2015-03-16 MED ORDER — 0.9 % SODIUM CHLORIDE (POUR BTL) OPTIME
TOPICAL | Status: DC | PRN
  Administered 2015-03-16: 1000 mL

## 2015-03-16 MED ORDER — ONDANSETRON HCL 4 MG/2ML IJ SOLN: 4.0000 mg | INTRAMUSCULAR | Status: DC | PRN

## 2015-03-16 MED ORDER — MIDAZOLAM HCL 2 MG/2ML IJ SOLN: 0.5000 mg | Freq: Once | INTRAMUSCULAR | Status: DC | PRN

## 2015-03-16 MED ORDER — BUPIVACAINE-EPINEPHRINE (PF) 0.25% -1:200000 IJ SOLN
INTRAMUSCULAR | Status: AC
  Filled 2015-03-16: qty 30

## 2015-03-16 MED ORDER — CEFAZOLIN SODIUM-DEXTROSE 2-3 GM-% IV SOLR
INTRAVENOUS | Status: AC
  Filled 2015-03-16: qty 50

## 2015-03-16 MED ORDER — CEFAZOLIN SODIUM-DEXTROSE 2-3 GM-% IV SOLR
2.0000 g | INTRAVENOUS | Status: AC
  Administered 2015-03-16: 2 g via INTRAVENOUS

## 2015-03-16 MED ORDER — HYDROMORPHONE HCL 1 MG/ML IJ SOLN
INTRAMUSCULAR | Status: AC
  Administered 2015-03-16: 0.5 mg via INTRAVENOUS
  Filled 2015-03-16: qty 1

## 2015-03-16 MED ORDER — CALCIUM CARB-CHOLECALCIFEROL 500-400 MG-UNIT PO TABS: 1.0000 | ORAL_TABLET | Freq: Every day | ORAL | Status: DC

## 2015-03-16 MED ORDER — HYDROCODONE-ACETAMINOPHEN 5-325 MG PO TABS: 1.0000 | ORAL_TABLET | ORAL | Status: DC | PRN

## 2015-03-16 MED ORDER — IRBESARTAN 150 MG PO TABS
150.0000 mg | ORAL_TABLET | Freq: Every day | ORAL | Status: DC
  Administered 2015-03-16 – 2015-03-17 (×2): 150 mg via ORAL
  Filled 2015-03-16 (×2): qty 1

## 2015-03-16 MED ORDER — CEFAZOLIN SODIUM-DEXTROSE 2-3 GM-% IV SOLR: 2.0000 g | INTRAVENOUS | Status: DC

## 2015-03-16 MED ORDER — SUCCINYLCHOLINE CHLORIDE 20 MG/ML IJ SOLN
INTRAMUSCULAR | Status: DC | PRN
  Administered 2015-03-16: 100 mg via INTRAVENOUS

## 2015-03-16 MED ORDER — OXYCODONE-ACETAMINOPHEN 5-325 MG PO TABS
1.0000 | ORAL_TABLET | ORAL | Status: DC | PRN
  Administered 2015-03-16 – 2015-03-17 (×5): 2 via ORAL
  Filled 2015-03-16 (×5): qty 2

## 2015-03-16 MED ORDER — SODIUM CHLORIDE 0.9 % IJ SOLN: 3.0000 mL | INTRAMUSCULAR | Status: DC | PRN

## 2015-03-16 MED ORDER — FLEET ENEMA 7-19 GM/118ML RE ENEM: 1.0000 | ENEMA | Freq: Once | RECTAL | Status: DC | PRN

## 2015-03-16 MED ORDER — SERTRALINE HCL 50 MG PO TABS: 50.0000 mg | ORAL_TABLET | Freq: Every day | ORAL | Status: DC

## 2015-03-16 MED ORDER — MENTHOL 3 MG MT LOZG: 1.0000 | LOZENGE | OROMUCOSAL | Status: DC | PRN

## 2015-03-16 MED ORDER — PROPOFOL 10 MG/ML IV BOLUS
INTRAVENOUS | Status: DC | PRN
  Administered 2015-03-16: 100 mg via INTRAVENOUS
  Administered 2015-03-16: 50 mg via INTRAVENOUS
  Administered 2015-03-16: 200 mg via INTRAVENOUS

## 2015-03-16 MED ORDER — ALPRAZOLAM 0.5 MG PO TABS: 0.5000 mg | ORAL_TABLET | Freq: Every day | ORAL | Status: DC | PRN

## 2015-03-16 MED ORDER — LACTATED RINGERS IV SOLN
INTRAVENOUS | Status: DC
  Administered 2015-03-16 (×4): via INTRAVENOUS

## 2015-03-16 MED ORDER — BISACODYL 5 MG PO TBEC: 5.0000 mg | DELAYED_RELEASE_TABLET | Freq: Every day | ORAL | Status: DC | PRN

## 2015-03-16 MED ORDER — MIDAZOLAM HCL 2 MG/2ML IJ SOLN
INTRAMUSCULAR | Status: AC
  Filled 2015-03-16: qty 4

## 2015-03-16 MED ORDER — PHENYLEPHRINE HCL 10 MG/ML IJ SOLN: 10.0000 mg | INTRAMUSCULAR | Status: DC | PRN

## 2015-03-16 MED ORDER — SODIUM CHLORIDE 0.9 % IJ SOLN: 3.0000 mL | Freq: Two times a day (BID) | INTRAMUSCULAR | Status: DC

## 2015-03-16 MED ORDER — ARTIFICIAL TEARS OP OINT
TOPICAL_OINTMENT | OPHTHALMIC | Status: DC | PRN
  Administered 2015-03-16: 1 via OPHTHALMIC

## 2015-03-16 MED ORDER — DOCUSATE SODIUM 100 MG PO CAPS
100.0000 mg | ORAL_CAPSULE | Freq: Two times a day (BID) | ORAL | Status: DC
  Administered 2015-03-16 – 2015-03-17 (×2): 100 mg via ORAL
  Filled 2015-03-16 (×2): qty 1

## 2015-03-16 MED ORDER — SENNOSIDES-DOCUSATE SODIUM 8.6-50 MG PO TABS: 1.0000 | ORAL_TABLET | Freq: Every evening | ORAL | Status: DC | PRN

## 2015-03-16 MED ORDER — ACETAMINOPHEN 650 MG RE SUPP: 650.0000 mg | RECTAL | Status: DC | PRN

## 2015-03-16 MED ORDER — SODIUM CHLORIDE 0.9 % IV SOLN: INTRAVENOUS | Status: DC

## 2015-03-16 MED ORDER — THROMBIN 20000 UNITS EX SOLR
CUTANEOUS | Status: AC
  Filled 2015-03-16: qty 20000

## 2015-03-16 MED ORDER — PROMETHAZINE HCL 25 MG/ML IJ SOLN: 6.2500 mg | INTRAMUSCULAR | Status: DC | PRN

## 2015-03-16 MED ORDER — MORPHINE SULFATE (PF) 2 MG/ML IV SOLN
1.0000 mg | INTRAVENOUS | Status: DC | PRN
  Administered 2015-03-16: 4 mg via INTRAVENOUS
  Filled 2015-03-16: qty 2

## 2015-03-16 MED ORDER — CEFAZOLIN SODIUM 1-5 GM-% IV SOLN
1.0000 g | Freq: Three times a day (TID) | INTRAVENOUS | Status: AC
  Administered 2015-03-16 – 2015-03-17 (×2): 1 g via INTRAVENOUS
  Filled 2015-03-16 (×2): qty 50

## 2015-03-16 MED ORDER — DEXTROSE 5 % IV SOLN
10.0000 mg | INTRAVENOUS | Status: DC | PRN
  Administered 2015-03-16 (×2): 30 ug/min via INTRAVENOUS
  Administered 2015-03-16: 20 ug/min via INTRAVENOUS

## 2015-03-16 MED ORDER — SCOPOLAMINE 1 MG/3DAYS TD PT72
1.0000 | MEDICATED_PATCH | Freq: Once | TRANSDERMAL | Status: DC
  Administered 2015-03-16: 1.5 mg via TRANSDERMAL

## 2015-03-16 MED ORDER — PHENOL 1.4 % MT LIQD: 1.0000 | OROMUCOSAL | Status: DC | PRN

## 2015-03-16 MED ORDER — BUPIVACAINE-EPINEPHRINE 0.25% -1:200000 IJ SOLN
INTRAMUSCULAR | Status: DC | PRN
  Administered 2015-03-16: 6 mL

## 2015-03-16 MED ORDER — ACETAMINOPHEN 325 MG PO TABS: 650.0000 mg | ORAL_TABLET | ORAL | Status: DC | PRN

## 2015-03-16 MED ORDER — PROPOFOL 10 MG/ML IV BOLUS
INTRAVENOUS | Status: AC
  Filled 2015-03-16: qty 20

## 2015-03-16 MED ORDER — MEPERIDINE HCL 25 MG/ML IJ SOLN: 6.2500 mg | INTRAMUSCULAR | Status: DC | PRN

## 2015-03-16 MED ORDER — SODIUM CHLORIDE 0.9 % IV SOLN: 250.0000 mL | INTRAVENOUS | Status: DC

## 2015-03-16 MED ORDER — PROPOFOL 500 MG/50ML IV EMUL
INTRAVENOUS | Status: DC | PRN
  Administered 2015-03-16: 50 ug/kg/min via INTRAVENOUS

## 2015-03-16 MED ORDER — ONDANSETRON HCL 4 MG/2ML IJ SOLN
INTRAMUSCULAR | Status: DC | PRN
  Administered 2015-03-16: 4 mg via INTRAVENOUS

## 2015-03-16 MED ORDER — SCOPOLAMINE 1 MG/3DAYS TD PT72
MEDICATED_PATCH | TRANSDERMAL | Status: AC
  Filled 2015-03-16: qty 1

## 2015-03-16 SURGICAL SUPPLY — 95 items
BENZOIN TINCTURE PRP APPL 2/3 (GAUZE/BANDAGES/DRESSINGS) ×3 IMPLANT
BLADE SURG 10 STRL SS (BLADE) ×3 IMPLANT
BLADE SURG ROTATE 9660 (MISCELLANEOUS) IMPLANT
BUR PRESCISION 1.7 ELITE (BURR) IMPLANT
BUR ROUND PRECISION 4.0 (BURR) IMPLANT
BUR SABER RD CUTTING 3.0 (BURR) IMPLANT
CARTRIDGE OIL MAESTRO DRILL (MISCELLANEOUS) ×2 IMPLANT
CLSR STERI-STRIP ANTIMIC 1/2X4 (GAUZE/BANDAGES/DRESSINGS) ×3 IMPLANT
CONT SPEC STER OR (MISCELLANEOUS) ×3 IMPLANT
COVER BACK TABLE 80X110 HD (DRAPES) ×3 IMPLANT
COVER MAYO STAND STRL (DRAPES) ×6 IMPLANT
COVER SURGICAL LIGHT HANDLE (MISCELLANEOUS) ×3 IMPLANT
DIFFUSER DRILL AIR PNEUMATIC (MISCELLANEOUS) ×3 IMPLANT
DRAIN CHANNEL 15F RND FF W/TCR (WOUND CARE) IMPLANT
DRAPE C-ARM 42X72 X-RAY (DRAPES) ×3 IMPLANT
DRAPE C-ARMOR (DRAPES) IMPLANT
DRAPE POUCH INSTRU U-SHP 10X18 (DRAPES) ×3 IMPLANT
DRAPE SURG 17X23 STRL (DRAPES) ×12 IMPLANT
DURAPREP 26ML APPLICATOR (WOUND CARE) ×3 IMPLANT
ELECT BLADE 4.0 EZ CLEAN MEGAD (MISCELLANEOUS) ×3
ELECT BLADE 6.5 EXT (BLADE) ×3 IMPLANT
ELECT CAUTERY BLADE 6.4 (BLADE) ×3 IMPLANT
ELECT REM PT RETURN 9FT ADLT (ELECTROSURGICAL) ×3
ELECTRODE BLDE 4.0 EZ CLN MEGD (MISCELLANEOUS) ×2 IMPLANT
ELECTRODE REM PT RTRN 9FT ADLT (ELECTROSURGICAL) ×2 IMPLANT
EVACUATOR SILICONE 100CC (DRAIN) IMPLANT
GAUZE SPONGE 4X4 12PLY STRL (GAUZE/BANDAGES/DRESSINGS) ×3 IMPLANT
GAUZE SPONGE 4X4 16PLY XRAY LF (GAUZE/BANDAGES/DRESSINGS) ×12 IMPLANT
GLOVE BIO SURGEON STRL SZ7 (GLOVE) ×6 IMPLANT
GLOVE BIO SURGEON STRL SZ8 (GLOVE) ×3 IMPLANT
GLOVE BIOGEL PI IND STRL 7.0 (GLOVE) ×2 IMPLANT
GLOVE BIOGEL PI IND STRL 8 (GLOVE) ×2 IMPLANT
GLOVE BIOGEL PI INDICATOR 7.0 (GLOVE) ×1
GLOVE BIOGEL PI INDICATOR 8 (GLOVE) ×1
GOWN STRL REUS W/ TWL LRG LVL3 (GOWN DISPOSABLE) ×4 IMPLANT
GOWN STRL REUS W/ TWL XL LVL3 (GOWN DISPOSABLE) ×4 IMPLANT
GOWN STRL REUS W/TWL LRG LVL3 (GOWN DISPOSABLE) ×2
GOWN STRL REUS W/TWL XL LVL3 (GOWN DISPOSABLE) ×2
GUIDEWIRE BLUNT VIPER II 1.45 (WIRE) ×3 IMPLANT
GUIDEWIRE SHARP VIPER II (WIRE) ×6 IMPLANT
INTERLOCK LORDOTIC 12X22X50 (Bone Implant) ×2 IMPLANT
IV CATH 14GX2 1/4 (CATHETERS) ×3 IMPLANT
KIT BASIN OR (CUSTOM PROCEDURE TRAY) ×3 IMPLANT
KIT DILATOR XLIF 5 (KITS) ×2 IMPLANT
KIT NEEDLE NVM5 EMG ELECT (KITS) ×2 IMPLANT
KIT NEEDLE NVM5 EMG ELECTRODE (KITS) ×1
KIT POSITION SURG JACKSON T1 (MISCELLANEOUS) ×3 IMPLANT
KIT ROOM TURNOVER OR (KITS) ×3 IMPLANT
KIT SURGICAL ACCESS MAXCESS 4 (KITS) ×3 IMPLANT
KIT XLIF (KITS) ×1
LORDOTIC 12X22X50 (Bone Implant) ×3 IMPLANT
MARKER SKIN DUAL TIP RULER LAB (MISCELLANEOUS) ×3 IMPLANT
MIX DBX 10CC 35% BONE (Bone Implant) ×3 IMPLANT
NDL SAFETY ECLIPSE 18X1.5 (NEEDLE) ×2 IMPLANT
NEEDLE 22X1 1/2 (OR ONLY) (NEEDLE) ×3 IMPLANT
NEEDLE HYPO 18GX1.5 SHARP (NEEDLE) ×1
NEEDLE HYPO 25GX1X1/2 BEV (NEEDLE) ×3 IMPLANT
NEEDLE JAMSHIDI VIPER (NEEDLE) ×6 IMPLANT
NEEDLE SPNL 18GX3.5 QUINCKE PK (NEEDLE) ×6 IMPLANT
NEEDLE SSEP/EMG (NEEDLE) ×3 IMPLANT
NS IRRIG 1000ML POUR BTL (IV SOLUTION) ×6 IMPLANT
OIL CARTRIDGE MAESTRO DRILL (MISCELLANEOUS) ×3
PACK LAMINECTOMY ORTHO (CUSTOM PROCEDURE TRAY) ×3 IMPLANT
PACK UNIVERSAL I (CUSTOM PROCEDURE TRAY) ×3 IMPLANT
PAD ARMBOARD 7.5X6 YLW CONV (MISCELLANEOUS) ×6 IMPLANT
PATTIES SURGICAL .5 X1 (DISPOSABLE) ×3 IMPLANT
PATTIES SURGICAL .5X1.5 (GAUZE/BANDAGES/DRESSINGS) ×3 IMPLANT
ROD VIPER II LORDOSED 5.5X40 (Rod) ×3 IMPLANT
SCREW SET SINGLE INNER MIS (Screw) ×6 IMPLANT
SCREW XTAB POLY VIPER  7X45 (Screw) ×2 IMPLANT
SCREW XTAB POLY VIPER 7X45 (Screw) ×4 IMPLANT
SPONGE INTESTINAL PEANUT (DISPOSABLE) ×6 IMPLANT
SPONGE LAP 4X18 X RAY DECT (DISPOSABLE) ×3 IMPLANT
SPONGE SURGIFOAM ABS GEL 100 (HEMOSTASIS) ×3 IMPLANT
STAPLER VISISTAT 35W (STAPLE) ×3 IMPLANT
STRIP CLOSURE SKIN 1/2X4 (GAUZE/BANDAGES/DRESSINGS) ×6 IMPLANT
SURGIFLO W/THROMBIN 8M KIT (HEMOSTASIS) IMPLANT
SUT MNCRL AB 4-0 PS2 18 (SUTURE) ×6 IMPLANT
SUT VIC AB 0 CT1 18XCR BRD 8 (SUTURE) ×2 IMPLANT
SUT VIC AB 0 CT1 8-18 (SUTURE) ×1
SUT VIC AB 1 CT1 18XCR BRD 8 (SUTURE) ×4 IMPLANT
SUT VIC AB 1 CT1 8-18 (SUTURE) ×2
SUT VIC AB 2-0 CT2 18 VCP726D (SUTURE) ×9 IMPLANT
SYR 20CC LL (SYRINGE) ×3 IMPLANT
SYR BULB IRRIGATION 50ML (SYRINGE) ×3 IMPLANT
SYR CONTROL 10ML LL (SYRINGE) ×6 IMPLANT
SYR TB 1ML LUER SLIP (SYRINGE) ×3 IMPLANT
TAP CANN VIPER2 DL 6.0 (TAP) ×3 IMPLANT
TAPE CLOTH SURG 4X10 WHT LF (GAUZE/BANDAGES/DRESSINGS) ×3 IMPLANT
TOWEL OR 17X24 6PK STRL BLUE (TOWEL DISPOSABLE) ×3 IMPLANT
TOWEL OR 17X26 10 PK STRL BLUE (TOWEL DISPOSABLE) ×3 IMPLANT
TRAY FOLEY CATH 16FR SILVER (SET/KITS/TRAYS/PACK) ×3 IMPLANT
TRAY FOLEY CATH 16FRSI W/METER (SET/KITS/TRAYS/PACK) ×3 IMPLANT
WATER STERILE IRR 1000ML POUR (IV SOLUTION) ×3 IMPLANT
YANKAUER SUCT BULB TIP NO VENT (SUCTIONS) ×3 IMPLANT

## 2015-03-16 NOTE — Anesthesia Postprocedure Evaluation (Signed)
  Anesthesia Post-op Note  Patient: [name]  Procedure(s) Performed: Procedure(s) with comments: ANTERIOR LATERAL LUMBAR FUSION 1 LEVEL (Left) - Left sided lumbar 4-5 lateral lumbar interbody fusion with instrumentation and allograft POSTERIOR LUMBAR FUSION 1 LEVEL (N/A) - Posterior spinal fusion, lumbar 4-5   Patient Location: PACU  Anesthesia Type:General  Level of Consciousness: awake, alert , oriented and patient cooperative  Airway and Oxygen Therapy: Patient Spontanous Breathing and Patient connected to nasal cannula oxygen  Post-op Pain: mild  Post-op Assessment: Post-op Vital signs reviewed, Patient's Cardiovascular Status Stable, Respiratory Function Stable, Patent Airway, No signs of Nausea or vomiting and Pain level controlled LLE Motor Response: Purposeful movement, Responds to commands LLE Sensation: Full sensation, No numbness, No tingling RLE Motor Response: Purposeful movement, Responds to commands RLE Sensation: Full sensation, No numbness, No tingling      Post-op Vital Signs: Reviewed and stable  Last Vitals:  Filed Vitals:   03/16/15 1700  BP: 140/69  Pulse: 105  Temp:   Resp: 17    Complications: No apparent anesthesia complications

## 2015-03-16 NOTE — Anesthesia Procedure Notes (Signed)
Procedure Name: Intubation Date/Time: 03/16/2015 12:34 PM Performed by: Wray KearnsFOLEY, Nila Winker A Pre-anesthesia Checklist: Patient identified, Emergency Drugs available, Suction available, Patient being monitored and Timeout performed Patient Re-evaluated:Patient Re-evaluated prior to inductionOxygen Delivery Method: Circle system utilized Preoxygenation: Pre-oxygenation with 100% oxygen Intubation Type: IV induction and Cricoid Pressure applied Ventilation: Mask ventilation without difficulty Laryngoscope Size: Mac and 3 Grade View: Grade I Tube type: Oral Tube size: 7.5 mm Number of attempts: 1 Airway Equipment and Method: Stylet Placement Confirmation: ETT inserted through vocal cords under direct vision,  positive ETCO2 and breath sounds checked- equal and bilateral Secured at: 21 cm Tube secured with: Tape Dental Injury: Teeth and Oropharynx as per pre-operative assessment

## 2015-03-16 NOTE — Anesthesia Preprocedure Evaluation (Addendum)
Anesthesia Evaluation  Patient identified by MRN, date of birth, ID band Patient awake    Reviewed: Allergy & Precautions, NPO status , Patient's Chart, lab work & pertinent test results  History of Anesthesia Complications (+) PONV and history of anesthetic complications  Airway Mallampati: I  TM Distance: >3 FB Neck ROM: Full    Dental  (+) Teeth Intact, Dental Advisory Given   Pulmonary asthma (no inhaler in years) ,    breath sounds clear to auscultation       Cardiovascular hypertension, Pt. on medications (-) angina Rhythm:Regular Rate:Normal  '11 stress test: normal   Neuro/Psych Chronic back pain    GI/Hepatic negative GI ROS, Neg liver ROS,   Endo/Other  diabetes (diet controlled, glu 150)Morbid obesity  Renal/GU negative Renal ROS     Musculoskeletal  (+) Arthritis , Osteoarthritis,    Abdominal (+) + obese,   Peds  Hematology negative hematology ROS (+)   Anesthesia Other Findings   Reproductive/Obstetrics                            Anesthesia Physical Anesthesia Plan  ASA: II  Anesthesia Plan: General   Post-op Pain Management:    Induction: Intravenous  Airway Management Planned: Oral ETT  Additional Equipment:   Intra-op Plan:   Post-operative Plan: Extubation in OR  Informed Consent: I have reviewed the patients History and Physical, chart, labs and discussed the procedure including the risks, benefits and alternatives for the proposed anesthesia with the patient or authorized representative who has indicated his/her understanding and acceptance.   Dental advisory given  Plan Discussed with: CRNA and Surgeon  Anesthesia Plan Comments: (Plan routine monitors, GETA)        Anesthesia Quick Evaluation

## 2015-03-16 NOTE — Transfer of Care (Signed)
Immediate Anesthesia Transfer of Care Note  Patient: Michelle EvertsVera Toth  Procedure(s) Performed: Procedure(s) with comments: ANTERIOR LATERAL LUMBAR FUSION 1 LEVEL (Left) - Left sided lumbar 4-5 lateral lumbar interbody fusion with instrumentation and allograft POSTERIOR LUMBAR FUSION 1 LEVEL (N/A) - Posterior spinal fusion, lumbar 4-5   Patient Location: PACU  Anesthesia Type:General  Level of Consciousness: awake, oriented, sedated, patient cooperative and responds to stimulation  Airway & Oxygen Therapy: Patient Spontanous Breathing and Patient connected to nasal cannula oxygen  Post-op Assessment: Report given to RN, Post -op Vital signs reviewed and stable, Patient moving all extremities and Patient moving all extremities X 4  Post vital signs: Reviewed and stable  Last Vitals:  Filed Vitals:   03/16/15 1634  BP: 148/72  Pulse: 108  Temp:   Resp: 17    Complications: No apparent anesthesia complications

## 2015-03-17 LAB — GLUCOSE, CAPILLARY: GLUCOSE-CAPILLARY: 118 mg/dL — AB (ref 65–99)

## 2015-03-17 NOTE — Op Note (Signed)
NAMAllene Oneill:  Pogosyan, Recia             ACCOUNT NO.:  [id number]  MEDICAL RECORD NO.:  [id number]  LOCATION:  3C11C                        FACILITY:  [facility]  PHYSICIAN:  Estill BambergMark Mylinda Brook, MD      DATE OF BIRTH:  04/28/1956  DATE OF PROCEDURE:  03/16/2015                              OPERATIVE REPORT   PREOPERATIVE DIAGNOSES: 1. Grade 1 L4-5 spondylolisthesis. 2. L4-5 spinal stenosis. 3. Bilateral leg pain.  POSTOPERATIVE DIAGNOSES: 1. Grade 1 L4-5 spondylolisthesis. 2. L4-5 spinal stenosis. 3. Bilateral leg pain.  PROCEDURE: 1. Anterior lumbar interbody fusion via a direct left-sided lateral     approach, L4-5. 2. Insertion of interbody device x1 (NuVasive 12 x 15 x 18 mm lordotic     intervertebral spacer). 3. Use of morselized allograft - DBX mix. 4. Intraoperative use of fluoroscopy. 5. Posterior instrumentation L4, L5. 6. Posterior spinal fusion, L4-5.  SURGEON:  Estill BambergMark Slyvia Lartigue, MD.  ASSISTANJason Coop:  Kayla McKenzie, PA-C.  ANESTHESIA:  General endotracheal anesthesia.  COMPLICATIONS:  None.  DISPOSITION:  Stable.  ESTIMATED BLOOD LOSS:  Minimal.  INDICATIONS FOR SURGERY:  Briefly, Ms. Michelle Oneill is a very pleasant 59- year-old female, who did present to me with pain in her bilateral legs, initially in October 2014.  Her pain did continue.  I did then evaluated her approximately 2 years later.  Of note, she did get temporary relief with appropriate conservative treatment measures, but did continue to have pain.  Her MRI was notable for moderate stenosis at L4-5 with a grade 1 spondylolisthesis.  We therefore did discuss proceeding with the procedure reflected above.  The patient did fully understand the risks and limitations of the procedure.  Of note, the patient did have trace weakness to her quadriceps musculature bilaterally preoperatively.  OPERATIVE DETAILS:  On March 16, 2015, the patient was brought to surgery and general endotracheal anesthesia was administered.   The patient was then placed in the lateral decubitus position with the left side up.  All bony prominences were padded.  The hips and knees were appropriately flexed.  Antibiotics were given.  I then appropriately positioned  the patient on the bed and AP and lateral images were obtained.  I then made a left-sided transverse incision in line with the L4-5 intervertebral space.  The external and internal oblique musculature was dissected.  The transversalis fascia was identified and entered.  The retroperitoneal space was readily identified.  The peritoneum was bluntly swept anteriorly.  The psoas was readily identified.  I then placed the initial dilator through the psoas musculature.  Of note, I did use neurologic monitoring throughout the surgery.  I did use triggered EMG while advancing the initial dilator and subsequent dilators.  It was noted on EMG testing that there were neurologic structures immediately posterior to the dilators.  I therefore positioned the self-retaining retractor slightly more anteriorly than typical in order to avoid the neurologic structures posteriorly.  The retractor was then placed and anchored to the bed using a rigid arm.  I dilated the retractor only very minimally so as not to cause any undue compression on the lumbar plexus.  I then proceeded with a thorough intervertebral diskectomy at the L4-5 intervertebral space.  The contralateral anulus was released.  Once the diskectomy was performed and the endplates were prepared, the appropriate size intervertebral spacer was packed with the DBX mix and tamped into position.  I was very pleased with the press-fit of the spacer in the AP and lateral fluoroscopic images.  The wound was then copiously irrigated.  The wound was then closed using #1 Vicryl, followed by 2-0 Vicryl, followed by 3-0 Monocryl.  I then turned my attention to the posterior aspect of the lumbar spine.  A paramedian incision was then  made just lateral to the L4 and L5 pedicles.  The posterolateral gutter was identified and the L4 and L5 transverse processes and posterior elements were decorticated.  The remainder of the DBX mix was then packed into the posterolateral gutter to help aid in the success of the fusion.  I then cannulated the L4-L5 pedicles in the usual manner using AP and lateral fluoroscopy.  I then used 6-mm taps to prepare the trajectory of the L4 and L5 pedicles in anticipation for the pedicle screws.  Of note, I did use triggered EMG to test the taps to ensure that the taps were not in the vicinity of any neurologic structures.  They were not, as there was no EMG activity noted below 20 milliamps.  I then inserted a 7 x 45 mm screws on the left at the L4 and L5 pedicles.  A 40-mm rod was placed and caps were then placed and the rod was secured using the caps in the usual manner.  A final locking procedure was performed.  I was very pleased with the final AP and lateral fluoroscopic images.  The wound was then copiously irrigated and closed in layers using #1 Vicryl, followed by 2-0 Vicryl, followed by 3- 0 Monocryl.  Benzoin and Steri-Strips were applied, followed by sterile dressing.  All instrument counts were correct at the termination of the procedure.  Of note, there was no sustained abnormal EMG activity noted throughout the procedure.  Also note, Jason Coop was my assistant throughout surgery, and did aid in retraction, suctioning, and closure throughout the surgery.     Estill Bamberg, MD     MD/MEDQ  D:  03/16/2015  T:  03/17/2015  Job:  [id number]

## 2015-03-17 NOTE — Evaluation (Signed)
Physical Therapy Evaluation Patient Details Name: Michelle Oneill MRN: [id number] DOB: 1955-05-26 Today's Date: 03/17/2015   History of Present Illness  s/p L4-5 ALIF. PMH: anxiety, hypertension, osteopenia, diabetes, asthma  Clinical Impression  Patient is s/p above surgery resulting in the deficits listed below (see PT Problem List).  Patient will benefit from skilled PT to increase their independence and safety with mobility (while adhering to their precautions) to allow discharge to home with family assistance. Patient able to ambulate 320 feet without an assistive device or noted instability. Patient denies any questions or concerns following session.     Follow Up Recommendations Supervision/Assistance - 24 hour;No PT follow up    Equipment Recommendations  None recommended by PT    Recommendations for Other Services       Precautions / Restrictions Precautions Precautions: Back Precaution Comments: reviewed back precautions Required Braces or Orthoses: Spinal Brace Spinal Brace: Applied in supine position;Lumbar corset Restrictions Weight Bearing Restrictions: No      Mobility  Bed Mobility Overal bed mobility: Needs Assistance Bed Mobility: Rolling;Sidelying to Sit;Sit to Sidelying Rolling: Supervision Sidelying to sit: Supervision     Sit to sidelying: Supervision General bed mobility comments: able to perform with verbal cues for logroll, bed flat, no rails  Transfers Overall transfer level: Modified independent Equipment used: None             General transfer comment: no instability noted  Ambulation/Gait Ambulation/Gait assistance: Supervision Ambulation Distance (Feet): 320 Feet Assistive device: None Gait Pattern/deviations: Step-through pattern Gait velocity: decreased   General Gait Details: even strides, no loss of balance noted.   Stairs Stairs:  (declined, reports no stairs at home)          Wheelchair Mobility    Modified  Rankin (Stroke Patients Only)       Balance Overall balance assessment: Needs assistance Sitting-balance support: No upper extremity supported Sitting balance-Leahy Scale: Good     Standing balance support: No upper extremity supported Standing balance-Leahy Scale: Good                               Pertinent Vitals/Pain Pain Assessment: 0-10 Pain Score: 8  Pain Location: back Pain Descriptors / Indicators: Aching Pain Intervention(s): Limited activity within patient's tolerance;Monitored during session    Home Living Family/patient expects to be discharged to:: Private residence Living Arrangements: Children Available Help at Discharge: Family;Available 24 hours/day Type of Home: House Home Access: Level entry     Home Layout: One level Home Equipment: None      Prior Function Level of Independence: Independent               Hand Dominance        Extremity/Trunk Assessment   Upper Extremity Assessment: Defer to OT evaluation           Lower Extremity Assessment: Overall WFL for tasks assessed        Communication   Communication: No difficulties  Cognition Arousal/Alertness: Awake/alert Behavior During Therapy: WFL for tasks assessed/performed Overall Cognitive Status: Within Functional Limits for tasks assessed                      General Comments General comments (skin integrity, edema, etc.): patient donning/doffing brace independently    Exercises        Assessment/Plan    PT Assessment Patient needs continued PT services  PT Diagnosis Difficulty walking  PT Problem List Decreased strength;Decreased range of motion;Decreased activity tolerance;Decreased balance;Decreased mobility;Pain  PT Treatment Interventions     PT Goals (Current goals can be found in the Care Plan section) Acute Rehab PT Goals Patient Stated Goal: go home PT Goal Formulation: With patient Time For Goal Achievement:  03/31/15 Potential to Achieve Goals: Good    Frequency Min 5X/week   Barriers to discharge        Co-evaluation               End of Session Equipment Utilized During Treatment: Gait belt;Back brace Activity Tolerance: Patient tolerated treatment well Patient left: in bed;with call bell/phone within reach Nurse Communication: Mobility status         Time: 4098-1191 PT Time Calculation (min) (ACUTE ONLY): 17 min   Charges:   PT Evaluation $Initial PT Evaluation Tier I: 1 Procedure     PT G Codes:        Christiane Ha, PT, CSCS Pager [phone] Office [phone]  03/17/2015, 2:04 PM

## 2015-03-17 NOTE — Progress Notes (Signed)
Occupational Therapy Evaluation Patient Details Name: Michelle Oneill MRN: [id number] DOB: 02-Feb-1956 Today's Date: 03/17/2015    History of Present Illness s/p L4-5 ALIF   Clinical Impression   Pt making excellent progress. Completed all education for back precautions and ADL and functional mobility with use of compensatory techniques, AE and DME. Pt able to return demonstrate. Pt safe to D/C home today with family with intermittent S. No further OT follow up needed. OT signing off.     Follow Up Recommendations  No OT follow up;Supervision - Intermittent    Equipment Recommendations  None recommended by OT    Recommendations for Other Services       Precautions / Restrictions Precautions Precautions: Back Precaution Booklet Issued: Yes (comment) Required Braces or Orthoses: Spinal Brace Spinal Brace: Applied in supine position;Lumbar corset      Mobility Bed Mobility Overal bed mobility: Modified Independent                Transfers Overall transfer level: Modified independent                    Balance Overall balance assessment: No apparent balance deficits (not formally assessed)                                          ADL Overall ADL's : Needs assistance/impaired     Grooming: Supervision/safety   Upper Body Bathing: Set up;Sitting   Lower Body Bathing: Minimal assistance;Sit to/from stand   Upper Body Dressing : Set up; independent with donning brace   Lower Body Dressing: Minimal assistance;Sit to/from stand   Toilet Transfer: Supervision/safety   Toileting- ArchitectClothing Manipulation and Hygiene: Minimal assistance       Functional mobility during ADLs: Supervision/safety General ADL Comments: Educated pt on compensatory techniques for ADL, in addition to availability of AE and DME for ADL Pt able to return demonstrate use of compensatory techniques and AE to be mod I with ADL. Completed education on use of AE for  pericare after toileting.Recommended pt have reacher for home use. Also educated pt on reducing risk of falls. Discussed use of shower chair for bathing if needed. Pt states she will get showerchair if needed but will most likley stand to bath. sons will be at home with her to assist if needed.      Vision     Perception     Praxis      Pertinent Vitals/Pain Pain Assessment: 0-10 Pain Score: 5  Pain Location: head and back Pain Descriptors / Indicators: Aching;Sore Pain Intervention(s): Limited activity within patient's tolerance;Monitored during session;Patient requesting pain meds-RN notified     Hand Dominance     Extremity/Trunk Assessment Upper Extremity Assessment Upper Extremity Assessment: Defer to OT evaluation   Lower Extremity Assessment Lower Extremity Assessment: Defer to PT evaluation   Cervical / Trunk Assessment Cervical / Trunk Assessment: Normal   Communication Communication Communication: No difficulties   Cognition Arousal/Alertness: Awake/alert Behavior During Therapy: WFL for tasks assessed/performed Overall Cognitive Status: Within Functional Limits for tasks assessed                     General Comments       Exercises       Shoulder Instructions      Home Living Family/patient expects to be discharged to:: Private residence Living Arrangements: Children Available Help at  Discharge: Family;Available 24 hours/day Type of Home: House       Home Layout: One level     Bathroom Shower/Tub: Tub/shower unit Shower/tub characteristics: Engineer, building services: Standard Bathroom Accessibility: Yes How Accessible: Accessible via walker Home Equipment: None          Prior Functioning/Environment Level of Independence: Independent             OT Diagnosis: Acute pain   OT Problem List: Decreased activity tolerance;Decreased knowledge of use of DME or AE;Decreased knowledge of precautions;Obesity;Pain   OT  Treatment/Interventions:      OT Goals(Current goals can be found in the care plan section) Acute Rehab OT Goals Patient Stated Goal: go home OT Goal Formulation: All assessment and education complete, DC therapy  OT Frequency:     Barriers to D/C:            Co-evaluation              End of Session Equipment Utilized During Treatment: Back brace Nurse Communication: Mobility status  Activity Tolerance: Patient tolerated treatment well Patient left: in chair;with call bell/phone within reach;with family/visitor present   Time: 1207-1222 OT Time Calculation (min): 15 min Charges:  OT General Charges $OT Visit: 1 Procedure OT Evaluation $Initial OT Evaluation Tier I: 1 Procedure G-Codes:    Michelle Oneill,Michelle Oneill 04-16-15, 12:59 PM   Michelle Oneill, OTR/L  [phone] 04-16-15

## 2015-03-17 NOTE — Progress Notes (Signed)
Pt doing well. Pt and family given D/C instructions with Rx's, verbal understanding was provided. Pt's IV was removed prior to D/C. Pt's incision is clean and dry with no sign of infection. Pt D/C'd home via wheelchair @ 1530 per MD order. Pt is stable @ D/C and has no other needs at this time. Rema FendtAshley Harmon Bommarito, RN

## 2015-03-17 NOTE — Progress Notes (Signed)
    Patient doing well Minimal back discomfort Patient denies leg pain Has been ambulating   Physical Exam: Filed Vitals:   03/17/15 0402  BP: 131/52  Pulse: 97  Temp: 98.1 F (36.7 C)  Resp:    Patient looks excellent and comfortable Dressing in place NVI  POD #1 s/p L4/5 A/P fusion, doing well  - up with PT/OT, encourage ambulation - Percocet for pain, Valium for muscle spasms - likely d/c home today with f/u in 2 weeks

## 2015-03-17 NOTE — Progress Notes (Signed)
Utiliziation review completed 

## 2015-03-20 ENCOUNTER — Encounter (HOSPITAL_COMMUNITY): Payer: Self-pay | Admitting: Orthopedic Surgery

## 2015-03-22 MED FILL — Thrombin For Soln 20000 Unit: CUTANEOUS | Qty: 1 | Status: AC

## 2015-04-06 NOTE — Discharge Summary (Signed)
Patient ID: Michelle Oneill MRN: [id number] DOB/AGE: 57-Oct-1957 57 y.o.  Admit date: 03/16/2015 Discharge date: 03/17/2015  Admission Diagnoses:  Active Problems:   Radiculopathy   Discharge Diagnoses:  Same  Past Medical History  Diagnosis Date  . Dyslipidemia   . Anxiety   . Shoulder pain 2009    s/p surgery  . Chest pain 2011    stress test neg  . HTN (hypertension)   . Postmenopausal   . Osteopenia   . Back pain   . PONV (postoperative nausea and vomiting)     "very sick"  . Family history of adverse reaction to anesthesia     "sisters also get nauseated"   . Seasonal allergies   . DM (diabetes mellitus) (HCC)     "diet controlled"  . Heart palpitations   . Asthma   . History of pneumonia   . History of bronchitis   . Arthritis     Surgeries: Procedure(s): ANTERIOR LATERAL LUMBAR FUSION 1 LEVEL L4-5 POSTERIOR LUMBAR FUSION 1 LEVEL L4-5 on 03/16/2015   Consultants: Vascular for exposure   Discharged Condition: Improved  Hospital Course: Michelle EvertsVera Que is an 59 y.o. female who was admitted 03/16/2015 for operative treatment of radiculopathy. Patient has severe unremitting pain that affects sleep, daily activities, and work/hobbies. After pre-op clearance the patient was taken to the operating room on 03/16/2015 and underwent  Procedure(s): ANTERIOR LATERAL LUMBAR FUSION 1 LEVEL L4-5 POSTERIOR LUMBAR FUSION 1 LEVEL.   L4-5  Patient was given perioperative antibiotics:  Anti-infectives    Start     Dose/Rate Route Frequency Ordered Stop   03/16/15 2000  ceFAZolin (ANCEF) IVPB 1 g/50 mL premix     1 g 100 mL/hr over 30 Minutes Intravenous Every 8 hours 03/16/15 1752 03/17/15 0412   03/16/15 0930  ceFAZolin (ANCEF) IVPB 2 g/50 mL premix     2 g 100 mL/hr over 30 Minutes Intravenous To ShortStay [facility] 03/16/15 0911 03/16/15 1315   03/16/15 0914  ceFAZolin (ANCEF) 2-3 GM-% IVPB SOLR    Comments:  Ray ChurchBowman, Jennifer   : cabinet override      03/16/15  0914 03/16/15 2129   03/16/15 0913  ceFAZolin (ANCEF) IVPB 2 g/50 mL premix  Status:  Discontinued     2 g 100 mL/hr over 30 Minutes Intravenous On call to O.R. 03/16/15 0913 03/16/15 0916       Patient was given sequential compression devices, early ambulation to prevent DVT.  Patient benefited maximally from hospital stay and there were no complications.    Recent vital signs: BP 162/76 mmHg  Pulse 110  Temp(Src) 98.7 F (37.1 C) (Oral)  Resp 18  Ht 5' 1.5" (1.562 m)  Wt 79.334 kg (174 lb 14.4 oz)  BMI 32.52 kg/m2  SpO2 95%  Discharge Medications:     Medication List    TAKE these medications        ALPRAZolam 0.5 MG tablet  Commonly known as:  XANAX  Take 0.5 mg by mouth daily as needed for anxiety.     aspirin EC 81 MG tablet  Take 81 mg by mouth daily.     CALCIUM 500+D3 500-400 MG-UNIT Tabs  Generic drug:  Calcium Carb-Cholecalciferol  Take 1 tablet by mouth at bedtime.     estradiol 1 MG tablet  Commonly known as:  ESTRACE  Take 1 mg by mouth daily with lunch.     MUCINEX D PO  Take 1 tablet by mouth daily as needed (  congestion).     rosuvastatin 10 MG tablet  Commonly known as:  CRESTOR  Take 10 mg by mouth at bedtime.     sertraline 50 MG tablet  Commonly known as:  ZOLOFT  Take 50 mg by mouth at bedtime.     triamcinolone cream 0.1 %  Commonly known as:  KENALOG  Apply 1 application topically 2 (two) times daily as needed (eczema).     valsartan 160 MG tablet  Commonly known as:  DIOVAN  Take 160 mg by mouth daily.     vitamin C 1000 MG tablet  Take 1,000 mg by mouth at bedtime.     vitamin E 400 UNIT capsule  Take 400 Units by mouth at bedtime.        Diagnostic Studies: Dg Chest 2 View  03/10/2015  CLINICAL DATA:  Preoperative exam prior to lumbar fusion, history of hypertension, palpitations, and asthma. EXAM: CHEST  2 VIEW COMPARISON:  Chest x-ray of July 13, 2007 FINDINGS: The lungs are adequately inflated and clear. The heart and  pulmonary vascularity are normal. The mediastinum is normal in width. There is no pleural effusion. The bony thorax exhibits no acute abnormality. IMPRESSION: There is no active cardiopulmonary disease. Electronically Signed   By: David  [place] M.D.   On: 03/10/2015 09:19   Dg Lumbar Spine 2-3 Views  03/16/2015  CLINICAL DATA:  XLIF L4-5.  Fluoro time is 2 minutes and 2 seconds EXAM: LUMBAR SPINE - 2-3 VIEW; DG C-ARM GT 120 MIN COMPARISON:  Lumbar spine MRI 01/30/2015 FINDINGS: Two intraoperative views are provided. Discectomy at L4-5 is present. Disc spacer is in satisfactory position. Right pedicle screw and rod fixation is present. IMPRESSION: Lumbar fusion at L4-5 without radiographic evidence for complication. Electronically Signed   By: Marin Roberts M.D.   On: 03/16/2015 15:48   Dg C-arm Gt 120 Min  03/16/2015  CLINICAL DATA:  XLIF L4-5.  Fluoro time is 2 minutes and 2 seconds EXAM: LUMBAR SPINE - 2-3 VIEW; DG C-ARM GT 120 MIN COMPARISON:  Lumbar spine MRI 01/30/2015 FINDINGS: Two intraoperative views are provided. Discectomy at L4-5 is present. Disc spacer is in satisfactory position. Right pedicle screw and rod fixation is present. IMPRESSION: Lumbar fusion at L4-5 without radiographic evidence for complication. Electronically Signed   By: Marin Roberts M.D.   On: 03/16/2015 15:48    Disposition: 01-Home or Self Care   POD #1 s/p L4/5 A/P fusion, doing well  - up with PT/OT, encourage ambulation - Percocet for pain, Valium for muscle spasms -Written scripts for pain signed and in chart -D/C instructions sheet printed and in chart -D/C today  -F/U in office 2 weeks   Signed: Georga Bora 04/06/2015, 11:37 AM

## 2016-03-19 ENCOUNTER — Other Ambulatory Visit: Payer: Self-pay | Admitting: Internal Medicine

## 2016-03-19 DIAGNOSIS — Z1231 Encounter for screening mammogram for malignant neoplasm of breast: Secondary | ICD-10-CM

## 2016-04-11 ENCOUNTER — Ambulatory Visit
Admission: RE | Admit: 2016-04-11 | Discharge: 2016-04-11 | Disposition: A | Discharge status: Home / Self Care | Payer: Commercial Managed Care - HMO | Source: Ambulatory Visit | Attending: Internal Medicine | Admitting: Internal Medicine

## 2016-04-11 DIAGNOSIS — Z1231 Encounter for screening mammogram for malignant neoplasm of breast: Secondary | ICD-10-CM

## 2016-05-10 DIAGNOSIS — M25572 Pain in left ankle and joints of left foot: Secondary | ICD-10-CM | POA: Diagnosis not present

## 2016-05-21 DIAGNOSIS — M7662 Achilles tendinitis, left leg: Secondary | ICD-10-CM | POA: Diagnosis not present

## 2016-05-30 DIAGNOSIS — R102 Pelvic and perineal pain: Secondary | ICD-10-CM | POA: Diagnosis not present

## 2016-06-11 DIAGNOSIS — M7662 Achilles tendinitis, left leg: Secondary | ICD-10-CM | POA: Diagnosis not present

## 2016-06-28 DIAGNOSIS — G5622 Lesion of ulnar nerve, left upper limb: Secondary | ICD-10-CM | POA: Diagnosis not present

## 2016-06-28 DIAGNOSIS — G5602 Carpal tunnel syndrome, left upper limb: Secondary | ICD-10-CM | POA: Diagnosis not present

## 2016-08-26 DIAGNOSIS — E782 Mixed hyperlipidemia: Secondary | ICD-10-CM | POA: Diagnosis not present

## 2016-08-26 DIAGNOSIS — Z1159 Encounter for screening for other viral diseases: Secondary | ICD-10-CM | POA: Diagnosis not present

## 2016-08-26 DIAGNOSIS — Z0000 Encounter for general adult medical examination without abnormal findings: Secondary | ICD-10-CM | POA: Diagnosis not present

## 2016-08-26 DIAGNOSIS — Z1389 Encounter for screening for other disorder: Secondary | ICD-10-CM | POA: Diagnosis not present

## 2016-08-26 DIAGNOSIS — E119 Type 2 diabetes mellitus without complications: Secondary | ICD-10-CM | POA: Diagnosis not present

## 2016-08-26 DIAGNOSIS — M519 Unspecified thoracic, thoracolumbar and lumbosacral intervertebral disc disorder: Secondary | ICD-10-CM | POA: Diagnosis not present

## 2016-08-26 DIAGNOSIS — I10 Essential (primary) hypertension: Secondary | ICD-10-CM | POA: Diagnosis not present

## 2016-08-26 DIAGNOSIS — Z23 Encounter for immunization: Secondary | ICD-10-CM | POA: Diagnosis not present

## 2016-09-05 DIAGNOSIS — Z01419 Encounter for gynecological examination (general) (routine) without abnormal findings: Secondary | ICD-10-CM | POA: Diagnosis not present

## 2016-10-01 DIAGNOSIS — I10 Essential (primary) hypertension: Secondary | ICD-10-CM | POA: Diagnosis not present

## 2016-10-21 DIAGNOSIS — G5622 Lesion of ulnar nerve, left upper limb: Secondary | ICD-10-CM | POA: Diagnosis not present

## 2016-10-21 DIAGNOSIS — G5602 Carpal tunnel syndrome, left upper limb: Secondary | ICD-10-CM | POA: Diagnosis not present

## 2016-11-07 DIAGNOSIS — H2513 Age-related nuclear cataract, bilateral: Secondary | ICD-10-CM | POA: Diagnosis not present

## 2016-11-07 DIAGNOSIS — H25013 Cortical age-related cataract, bilateral: Secondary | ICD-10-CM | POA: Diagnosis not present

## 2016-11-07 DIAGNOSIS — E119 Type 2 diabetes mellitus without complications: Secondary | ICD-10-CM | POA: Diagnosis not present

## 2016-12-21 IMAGING — CR DG CHEST 2V
2 series · 2 of 2 positions shown · non-contrast
Comparison: Chest x-ray of July 13, 2007

CLINICAL DATA: Preoperative exam prior to lumbar fusion, history of
hypertension, palpitations, and asthma.

EXAM:
CHEST  2 VIEW

[w chest pa]
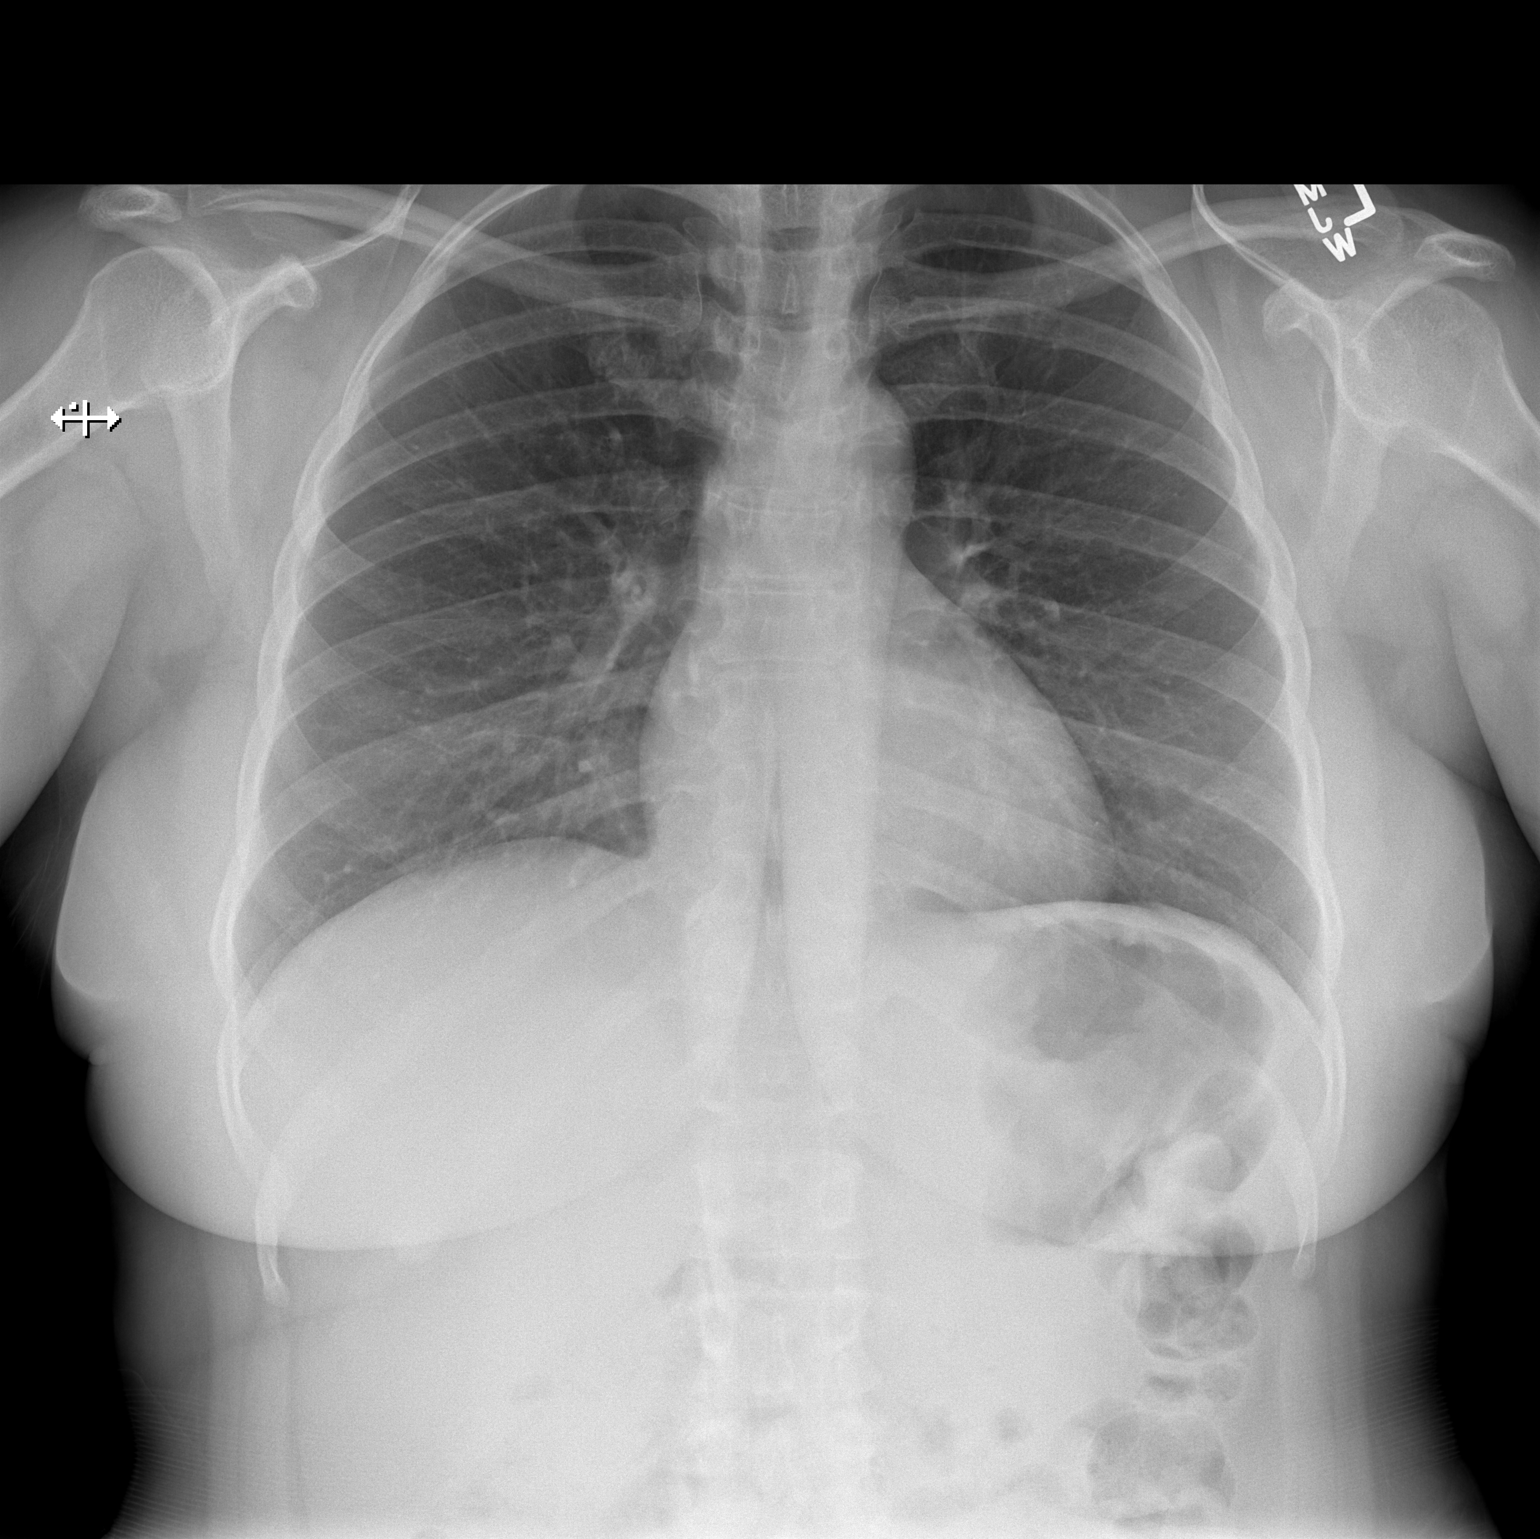

[w chest lat]
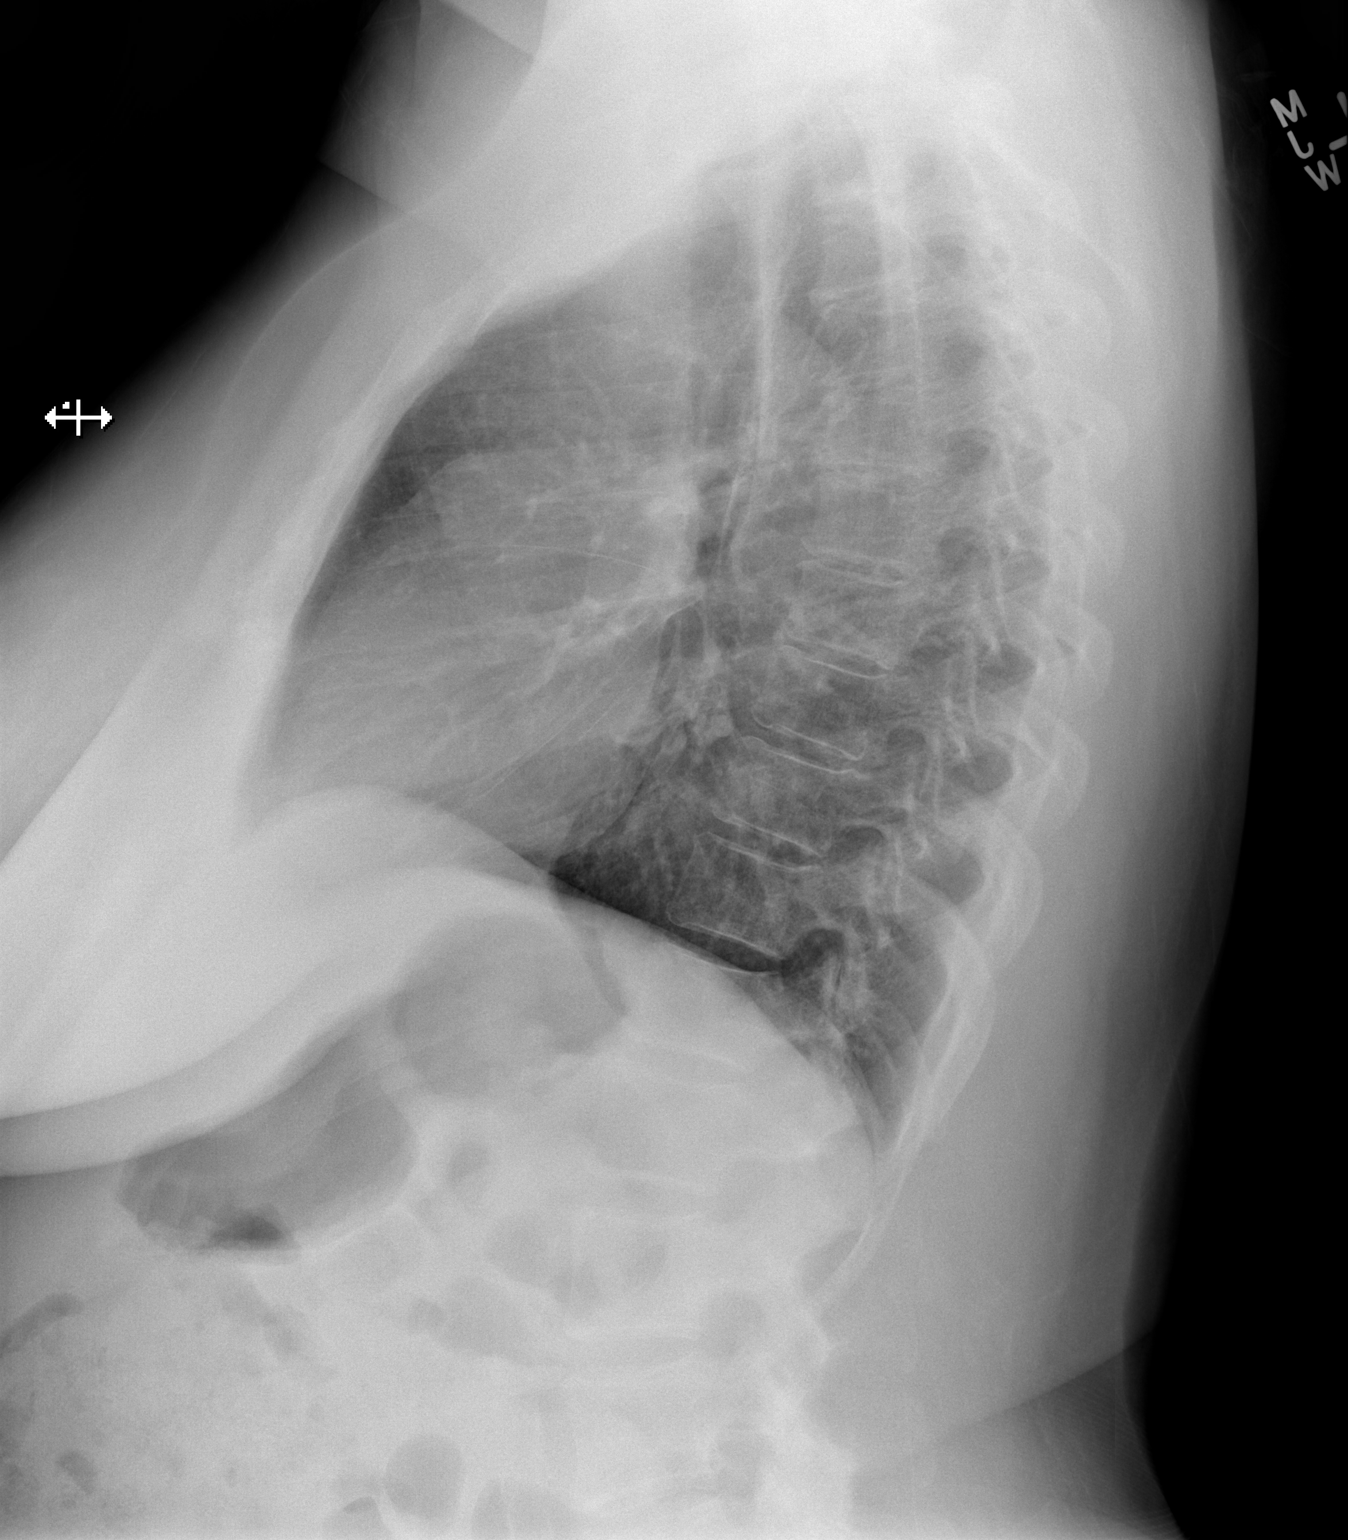

[2 of 2 positions shown; findings below may reference images not displayed]

FINDINGS: The lungs are adequately inflated and clear. The heart and pulmonary
vascularity are normal. The mediastinum is normal in width. There is
no pleural effusion. The bony thorax exhibits no acute abnormality.
IMPRESSION: There is no active cardiopulmonary disease.

## 2016-12-27 DIAGNOSIS — Z23 Encounter for immunization: Secondary | ICD-10-CM | POA: Diagnosis not present

## 2016-12-27 IMAGING — RF DG C-ARM GT 120 MIN
1 series · 2 of 2 positions shown · non-contrast
Comparison: Lumbar spine MRI 01/30/2015

CLINICAL DATA: XLIF L4-5.  Fluoro time is 2 minutes and 2 seconds

EXAM:
LUMBAR SPINE - 2-3 VIEW; DG C-ARM GT 120 MIN

[Series 1: run · 2 of 2 slices shown]
[im 1/2]
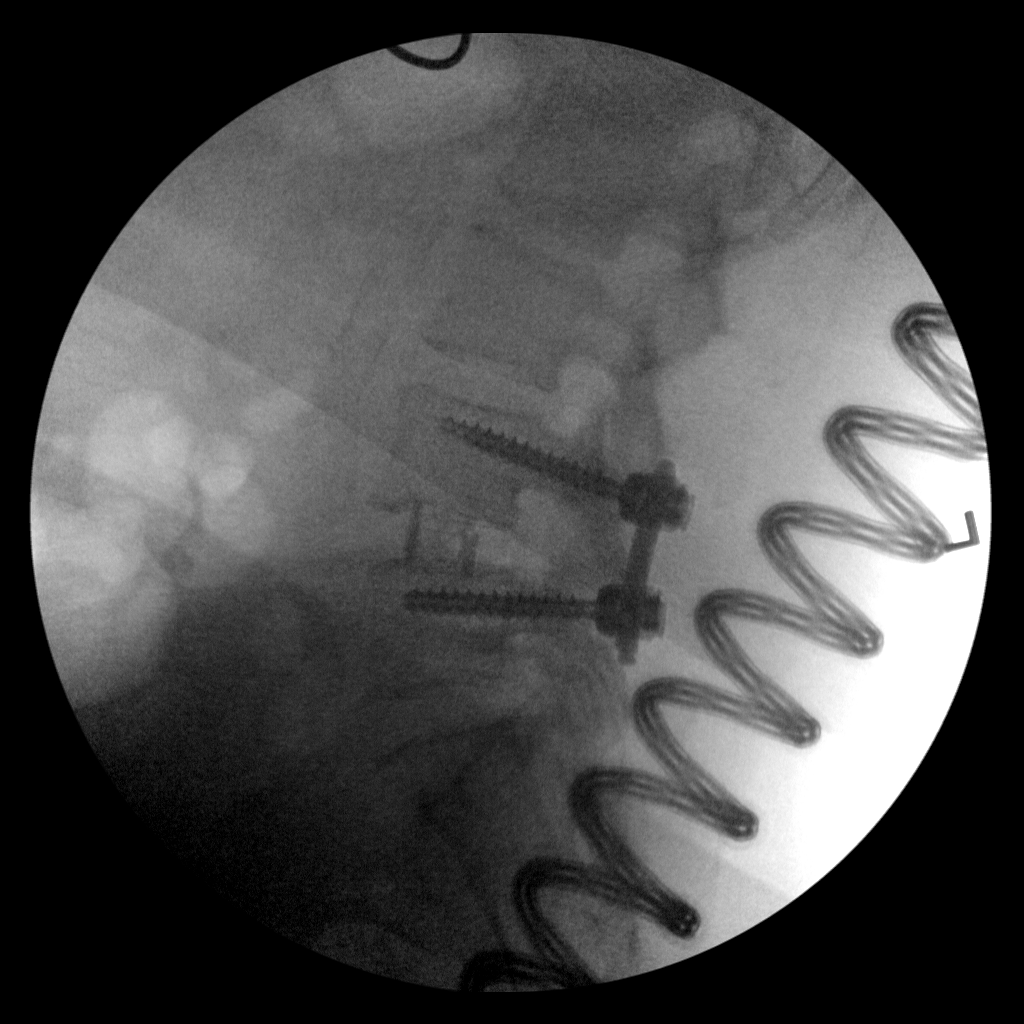
[im 2/2]
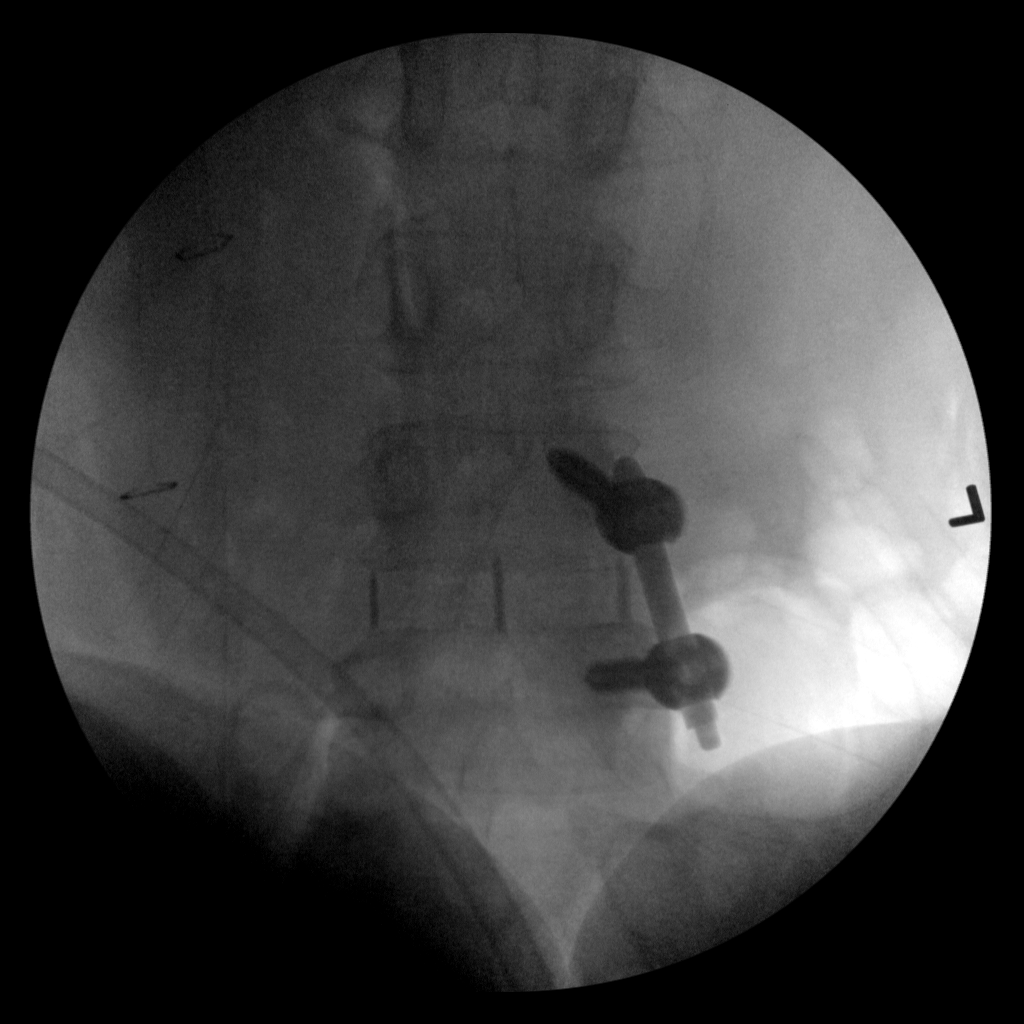

[2 of 2 positions shown; findings below may reference images not displayed]

FINDINGS: Two intraoperative views are provided. Discectomy at L4-5 is
present. Disc spacer is in satisfactory position. Right pedicle
screw and rod fixation is present.
IMPRESSION: Lumbar fusion at L4-5 without radiographic evidence for
complication.

## 2017-01-09 DIAGNOSIS — M7662 Achilles tendinitis, left leg: Secondary | ICD-10-CM | POA: Diagnosis not present

## 2017-02-10 DIAGNOSIS — M7662 Achilles tendinitis, left leg: Secondary | ICD-10-CM | POA: Diagnosis not present

## 2017-02-26 DIAGNOSIS — I10 Essential (primary) hypertension: Secondary | ICD-10-CM | POA: Diagnosis not present

## 2017-02-26 DIAGNOSIS — E119 Type 2 diabetes mellitus without complications: Secondary | ICD-10-CM | POA: Diagnosis not present

## 2017-04-03 ENCOUNTER — Other Ambulatory Visit: Payer: Self-pay | Admitting: Internal Medicine

## 2017-04-03 DIAGNOSIS — Z1231 Encounter for screening mammogram for malignant neoplasm of breast: Secondary | ICD-10-CM

## 2017-04-09 DIAGNOSIS — M7662 Achilles tendinitis, left leg: Secondary | ICD-10-CM | POA: Diagnosis not present

## 2017-04-09 DIAGNOSIS — M25572 Pain in left ankle and joints of left foot: Secondary | ICD-10-CM | POA: Diagnosis not present

## 2017-04-30 ENCOUNTER — Ambulatory Visit
Admission: RE | Admit: 2017-04-30 | Discharge: 2017-04-30 | Disposition: A | Discharge status: Home / Self Care | Payer: Commercial Managed Care - HMO | Source: Ambulatory Visit | Attending: Internal Medicine | Admitting: Internal Medicine

## 2017-04-30 DIAGNOSIS — Z1231 Encounter for screening mammogram for malignant neoplasm of breast: Secondary | ICD-10-CM | POA: Diagnosis not present

## 2017-04-30 DIAGNOSIS — M7662 Achilles tendinitis, left leg: Secondary | ICD-10-CM | POA: Diagnosis not present

## 2017-04-30 DIAGNOSIS — M7661 Achilles tendinitis, right leg: Secondary | ICD-10-CM | POA: Diagnosis not present

## 2017-08-04 DIAGNOSIS — M65321 Trigger finger, right index finger: Secondary | ICD-10-CM | POA: Diagnosis not present

## 2017-08-04 DIAGNOSIS — M65311 Trigger thumb, right thumb: Secondary | ICD-10-CM | POA: Diagnosis not present

## 2017-09-02 DIAGNOSIS — Z0000 Encounter for general adult medical examination without abnormal findings: Secondary | ICD-10-CM | POA: Diagnosis not present

## 2017-09-02 DIAGNOSIS — E782 Mixed hyperlipidemia: Secondary | ICD-10-CM | POA: Diagnosis not present

## 2017-09-02 DIAGNOSIS — E1169 Type 2 diabetes mellitus with other specified complication: Secondary | ICD-10-CM | POA: Diagnosis not present

## 2017-09-08 DIAGNOSIS — Z01419 Encounter for gynecological examination (general) (routine) without abnormal findings: Secondary | ICD-10-CM | POA: Diagnosis not present

## 2017-09-17 DIAGNOSIS — G5621 Lesion of ulnar nerve, right upper limb: Secondary | ICD-10-CM | POA: Diagnosis not present

## 2017-09-17 DIAGNOSIS — G5601 Carpal tunnel syndrome, right upper limb: Secondary | ICD-10-CM | POA: Diagnosis not present

## 2017-10-20 DIAGNOSIS — G5621 Lesion of ulnar nerve, right upper limb: Secondary | ICD-10-CM | POA: Diagnosis not present

## 2017-10-20 DIAGNOSIS — G5601 Carpal tunnel syndrome, right upper limb: Secondary | ICD-10-CM | POA: Diagnosis not present

## 2017-10-20 DIAGNOSIS — M65311 Trigger thumb, right thumb: Secondary | ICD-10-CM | POA: Diagnosis not present

## 2017-11-05 DIAGNOSIS — J019 Acute sinusitis, unspecified: Secondary | ICD-10-CM | POA: Diagnosis not present

## 2017-11-19 DIAGNOSIS — M65311 Trigger thumb, right thumb: Secondary | ICD-10-CM | POA: Diagnosis not present

## 2017-11-27 DIAGNOSIS — E119 Type 2 diabetes mellitus without complications: Secondary | ICD-10-CM | POA: Diagnosis not present

## 2018-02-11 DIAGNOSIS — L249 Irritant contact dermatitis, unspecified cause: Secondary | ICD-10-CM | POA: Diagnosis not present

## 2018-02-11 DIAGNOSIS — L2084 Intrinsic (allergic) eczema: Secondary | ICD-10-CM | POA: Diagnosis not present

## 2018-02-24 DIAGNOSIS — M65321 Trigger finger, right index finger: Secondary | ICD-10-CM | POA: Diagnosis not present

## 2018-02-24 DIAGNOSIS — G5621 Lesion of ulnar nerve, right upper limb: Secondary | ICD-10-CM | POA: Diagnosis not present

## 2018-02-24 DIAGNOSIS — G5601 Carpal tunnel syndrome, right upper limb: Secondary | ICD-10-CM | POA: Diagnosis not present

## 2018-02-27 ENCOUNTER — Other Ambulatory Visit: Payer: Self-pay | Admitting: Orthopedic Surgery

## 2018-02-27 DIAGNOSIS — G8929 Other chronic pain: Secondary | ICD-10-CM

## 2018-02-27 DIAGNOSIS — M79644 Pain in right finger(s): Principal | ICD-10-CM

## 2018-03-04 DIAGNOSIS — E782 Mixed hyperlipidemia: Secondary | ICD-10-CM | POA: Diagnosis not present

## 2018-03-04 DIAGNOSIS — E1169 Type 2 diabetes mellitus with other specified complication: Secondary | ICD-10-CM | POA: Diagnosis not present

## 2018-03-04 DIAGNOSIS — I10 Essential (primary) hypertension: Secondary | ICD-10-CM | POA: Diagnosis not present

## 2018-03-11 ENCOUNTER — Ambulatory Visit
Admission: RE | Admit: 2018-03-11 | Discharge: 2018-03-11 | Disposition: A | Discharge status: Home / Self Care | Payer: Commercial Managed Care - HMO | Source: Ambulatory Visit | Attending: Orthopedic Surgery | Admitting: Orthopedic Surgery

## 2018-03-11 DIAGNOSIS — G8929 Other chronic pain: Secondary | ICD-10-CM

## 2018-03-11 DIAGNOSIS — M79644 Pain in right finger(s): Principal | ICD-10-CM

## 2018-03-11 DIAGNOSIS — M75111 Incomplete rotator cuff tear or rupture of right shoulder, not specified as traumatic: Secondary | ICD-10-CM | POA: Diagnosis not present

## 2018-03-11 MED ORDER — GADOBENATE DIMEGLUMINE 529 MG/ML IV SOLN
15.0000 mL | Freq: Once | INTRAVENOUS | Status: AC | PRN
  Administered 2018-03-11: 15 mL via INTRAVENOUS

## 2018-03-23 ENCOUNTER — Other Ambulatory Visit: Payer: Self-pay | Admitting: Internal Medicine

## 2018-03-23 DIAGNOSIS — Z1231 Encounter for screening mammogram for malignant neoplasm of breast: Secondary | ICD-10-CM

## 2018-03-25 DIAGNOSIS — L658 Other specified nonscarring hair loss: Secondary | ICD-10-CM | POA: Diagnosis not present

## 2018-04-10 DIAGNOSIS — M65311 Trigger thumb, right thumb: Secondary | ICD-10-CM | POA: Diagnosis not present

## 2018-04-10 DIAGNOSIS — G5601 Carpal tunnel syndrome, right upper limb: Secondary | ICD-10-CM | POA: Diagnosis not present

## 2018-04-10 DIAGNOSIS — G5621 Lesion of ulnar nerve, right upper limb: Secondary | ICD-10-CM | POA: Diagnosis not present

## 2018-05-04 DIAGNOSIS — Z23 Encounter for immunization: Secondary | ICD-10-CM | POA: Diagnosis not present

## 2018-05-05 ENCOUNTER — Ambulatory Visit: Payer: Commercial Managed Care - HMO

## 2018-06-03 DIAGNOSIS — E1169 Type 2 diabetes mellitus with other specified complication: Secondary | ICD-10-CM | POA: Diagnosis not present

## 2018-06-03 DIAGNOSIS — I10 Essential (primary) hypertension: Secondary | ICD-10-CM | POA: Diagnosis not present

## 2018-06-03 DIAGNOSIS — E782 Mixed hyperlipidemia: Secondary | ICD-10-CM | POA: Diagnosis not present

## 2018-06-23 ENCOUNTER — Ambulatory Visit
Admission: RE | Admit: 2018-06-23 | Discharge: 2018-06-23 | Disposition: A | Discharge status: Home / Self Care | Payer: 59 | Source: Ambulatory Visit | Attending: Internal Medicine | Admitting: Internal Medicine

## 2018-06-23 DIAGNOSIS — Z1231 Encounter for screening mammogram for malignant neoplasm of breast: Secondary | ICD-10-CM | POA: Diagnosis not present

## 2018-07-02 DIAGNOSIS — Z23 Encounter for immunization: Secondary | ICD-10-CM | POA: Diagnosis not present

## 2018-09-01 DIAGNOSIS — E1169 Type 2 diabetes mellitus with other specified complication: Secondary | ICD-10-CM | POA: Diagnosis not present

## 2018-09-01 DIAGNOSIS — E782 Mixed hyperlipidemia: Secondary | ICD-10-CM | POA: Diagnosis not present

## 2018-09-01 DIAGNOSIS — I10 Essential (primary) hypertension: Secondary | ICD-10-CM | POA: Diagnosis not present

## 2018-09-04 DIAGNOSIS — I10 Essential (primary) hypertension: Secondary | ICD-10-CM | POA: Diagnosis not present

## 2018-09-04 DIAGNOSIS — E782 Mixed hyperlipidemia: Secondary | ICD-10-CM | POA: Diagnosis not present

## 2018-09-04 DIAGNOSIS — E1169 Type 2 diabetes mellitus with other specified complication: Secondary | ICD-10-CM | POA: Diagnosis not present

## 2018-09-08 DIAGNOSIS — G5601 Carpal tunnel syndrome, right upper limb: Secondary | ICD-10-CM | POA: Diagnosis not present

## 2018-09-08 DIAGNOSIS — M65311 Trigger thumb, right thumb: Secondary | ICD-10-CM | POA: Diagnosis not present

## 2018-09-08 DIAGNOSIS — G5621 Lesion of ulnar nerve, right upper limb: Secondary | ICD-10-CM | POA: Diagnosis not present

## 2019-02-16 ENCOUNTER — Other Ambulatory Visit: Payer: Self-pay | Admitting: Obstetrics and Gynecology

## 2019-02-16 DIAGNOSIS — M8580 Other specified disorders of bone density and structure, unspecified site: Secondary | ICD-10-CM

## 2019-02-19 ENCOUNTER — Other Ambulatory Visit: Payer: Self-pay

## 2019-02-19 ENCOUNTER — Ambulatory Visit
Admission: RE | Admit: 2019-02-19 | Discharge: 2019-02-19 | Disposition: A | Discharge status: Home / Self Care | Payer: 59 | Source: Ambulatory Visit | Attending: Obstetrics and Gynecology | Admitting: Obstetrics and Gynecology

## 2019-02-19 DIAGNOSIS — M8580 Other specified disorders of bone density and structure, unspecified site: Secondary | ICD-10-CM

## 2019-05-19 ENCOUNTER — Other Ambulatory Visit: Payer: Self-pay | Admitting: Internal Medicine

## 2019-05-19 DIAGNOSIS — Z1231 Encounter for screening mammogram for malignant neoplasm of breast: Secondary | ICD-10-CM

## 2019-06-28 ENCOUNTER — Other Ambulatory Visit: Payer: Self-pay

## 2019-06-28 ENCOUNTER — Ambulatory Visit
Admission: RE | Admit: 2019-06-28 | Discharge: 2019-06-28 | Disposition: A | Discharge status: Home / Self Care | Payer: 59 | Source: Ambulatory Visit | Attending: Internal Medicine | Admitting: Internal Medicine

## 2019-06-28 DIAGNOSIS — Z1231 Encounter for screening mammogram for malignant neoplasm of breast: Secondary | ICD-10-CM

## 2019-07-17 ENCOUNTER — Ambulatory Visit: Payer: 59 | Attending: Internal Medicine

## 2019-07-17 DIAGNOSIS — Z23 Encounter for immunization: Secondary | ICD-10-CM

## 2019-07-17 NOTE — Progress Notes (Signed)
   Covid-19 Vaccination Clinic  Name:  Nakiyah Beverley    MRN: [id number] DOB: January 08, 1956  07/17/2019  Ms. Hitch was observed post Covid-19 immunization for 15 minutes without incident. She was provided with Vaccine Information Sheet and instruction to access the V-Safe system.   Ms. Vondra was instructed to call 911 with any severe reactions post vaccine: Marland Kitchen Difficulty breathing  . Swelling of face and throat  . A fast heartbeat  . A bad rash all over body  . Dizziness and weakness   Immunizations Administered    Name Date Dose VIS Date Route   Pfizer COVID-19 Vaccine 07/17/2019  1:11 PM 0.3 mL 04/16/2019 Intramuscular   Manufacturer: [employer], [employer]   Lot: [id number]   NDC: [address]-8

## 2019-08-10 ENCOUNTER — Ambulatory Visit: Payer: 59 | Attending: Internal Medicine

## 2019-08-10 DIAGNOSIS — Z23 Encounter for immunization: Secondary | ICD-10-CM

## 2019-08-10 NOTE — Progress Notes (Signed)
   Covid-19 Vaccination Clinic  Name:  Michelle Oneill    MRN: [id number] DOB: 1955/12/03  08/10/2019  [name] was observed post Covid-19 immunization for 15 minutes without incident. She was provided with Vaccine Information Sheet and instruction to access the V-Safe system.   [name] was instructed to call 911 with any severe reactions post vaccine: Marland Kitchen Difficulty breathing  . Swelling of face and throat  . A fast heartbeat  . A bad rash all over body  . Dizziness and weakness   Immunizations Administered    Name Date Dose VIS Date Route   Pfizer COVID-19 Vaccine 08/10/2019 12:36 PM 0.3 mL 04/16/2019 Intramuscular   Manufacturer: [employer], [employer]   Lot: [id number]   NDC: [address]-3

## 2020-01-14 ENCOUNTER — Other Ambulatory Visit: Payer: Self-pay | Admitting: Orthopedic Surgery

## 2020-01-14 DIAGNOSIS — M5416 Radiculopathy, lumbar region: Secondary | ICD-10-CM

## 2020-02-05 ENCOUNTER — Ambulatory Visit
Admission: RE | Admit: 2020-02-05 | Discharge: 2020-02-05 | Disposition: A | Discharge status: Home / Self Care | Payer: 59 | Source: Ambulatory Visit | Attending: Orthopedic Surgery | Admitting: Orthopedic Surgery

## 2020-02-05 ENCOUNTER — Other Ambulatory Visit: Payer: Self-pay

## 2020-02-05 DIAGNOSIS — M5416 Radiculopathy, lumbar region: Secondary | ICD-10-CM

## 2020-04-05 IMAGING — MG DIGITAL SCREENING BILATERAL MAMMOGRAM WITH CAD
4 series · 4 of 4 positions shown · non-contrast
Comparison: Previous exam(s).

CLINICAL DATA: Screening.

EXAM:
DIGITAL SCREENING BILATERAL MAMMOGRAM WITH CAD

[R CC]
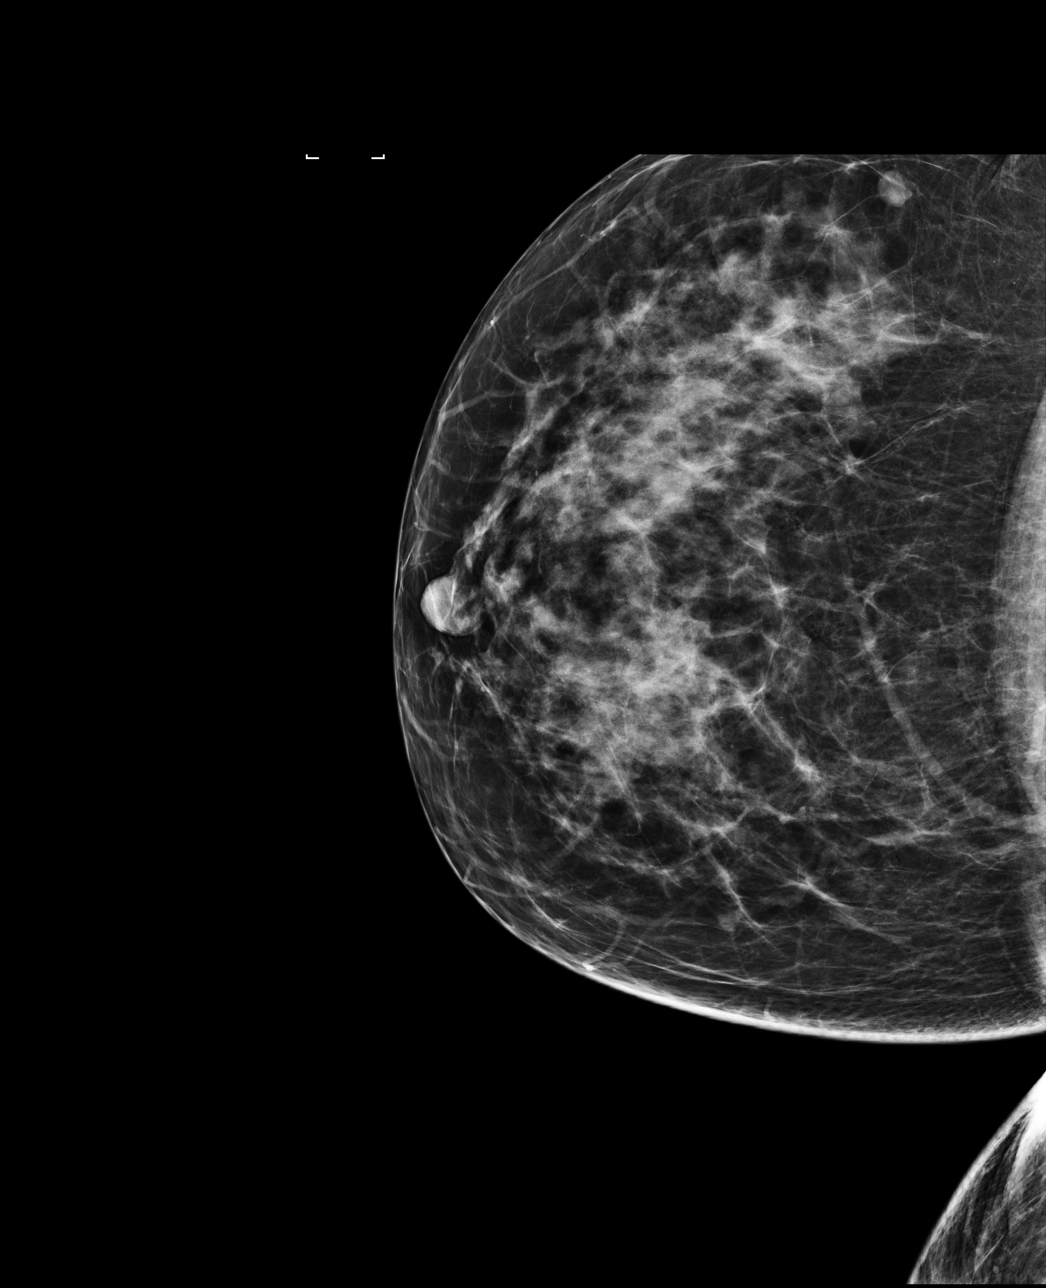

[L CC]
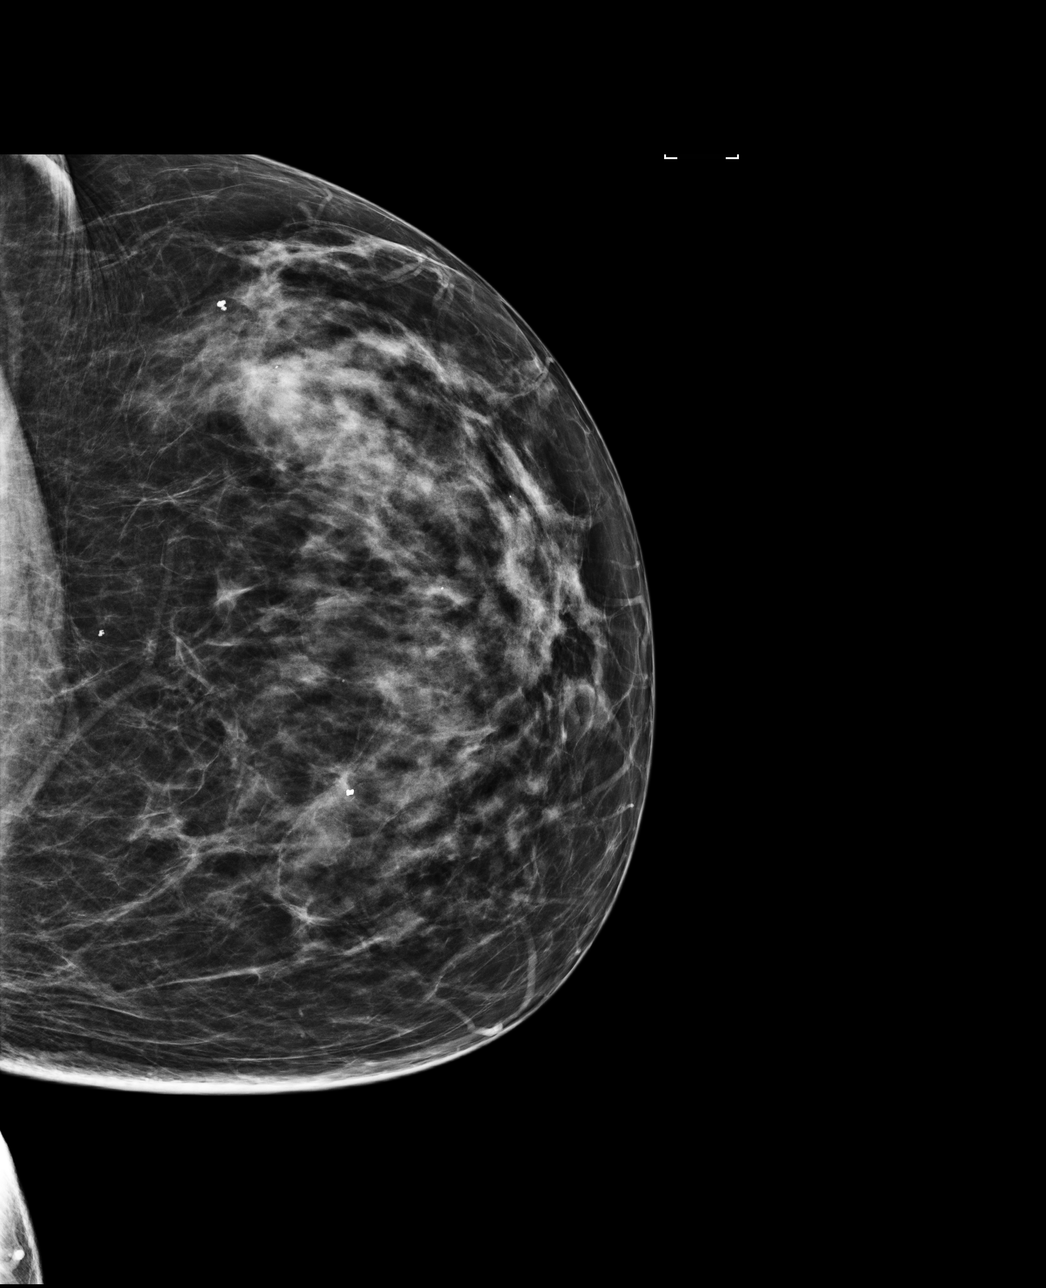

[R MLO]
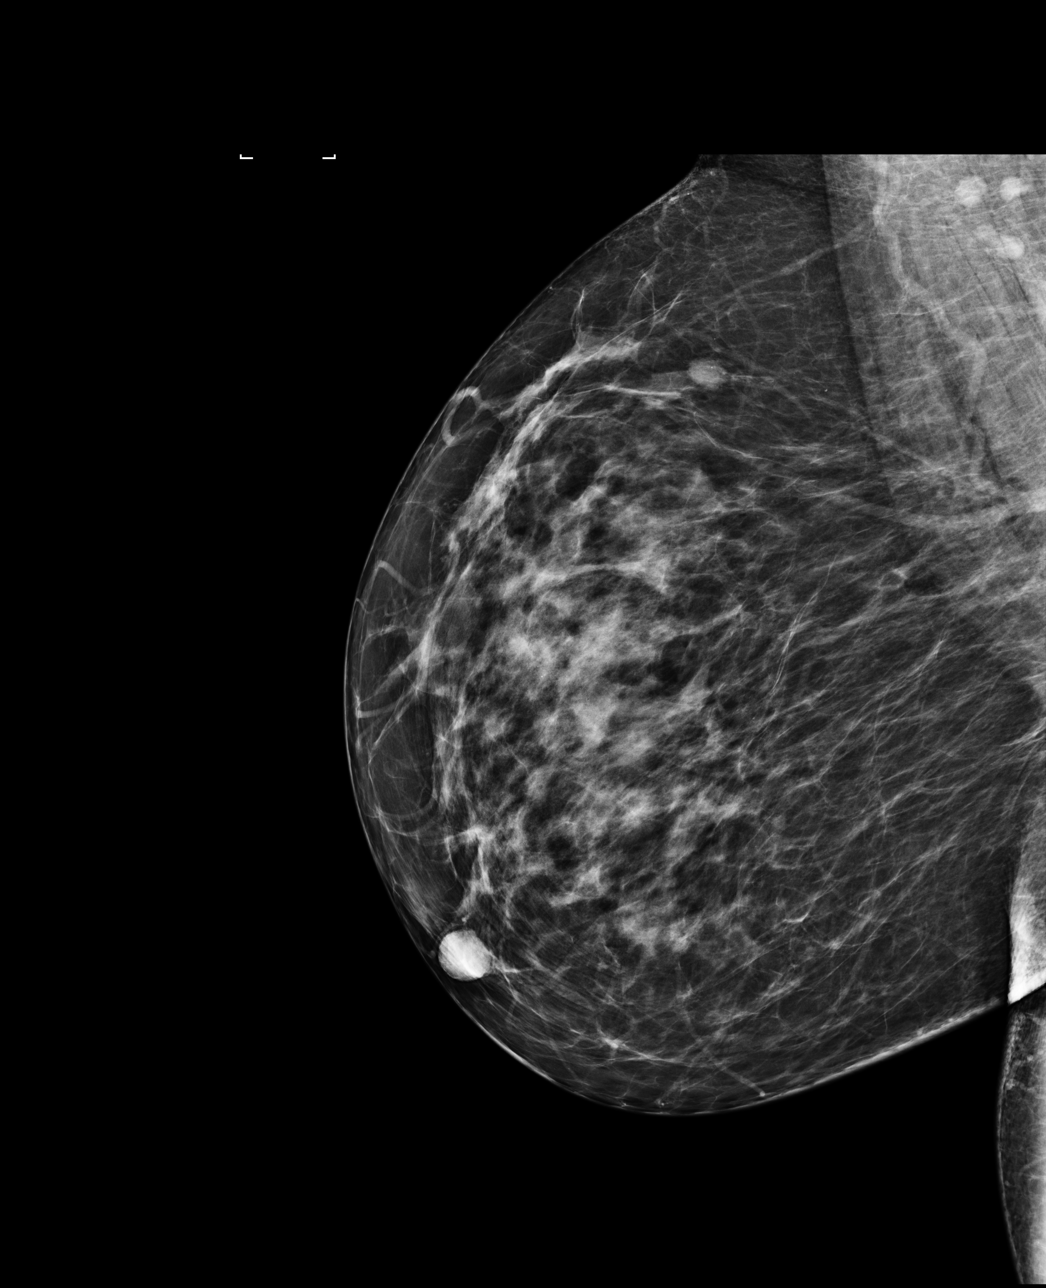

[L MLO]
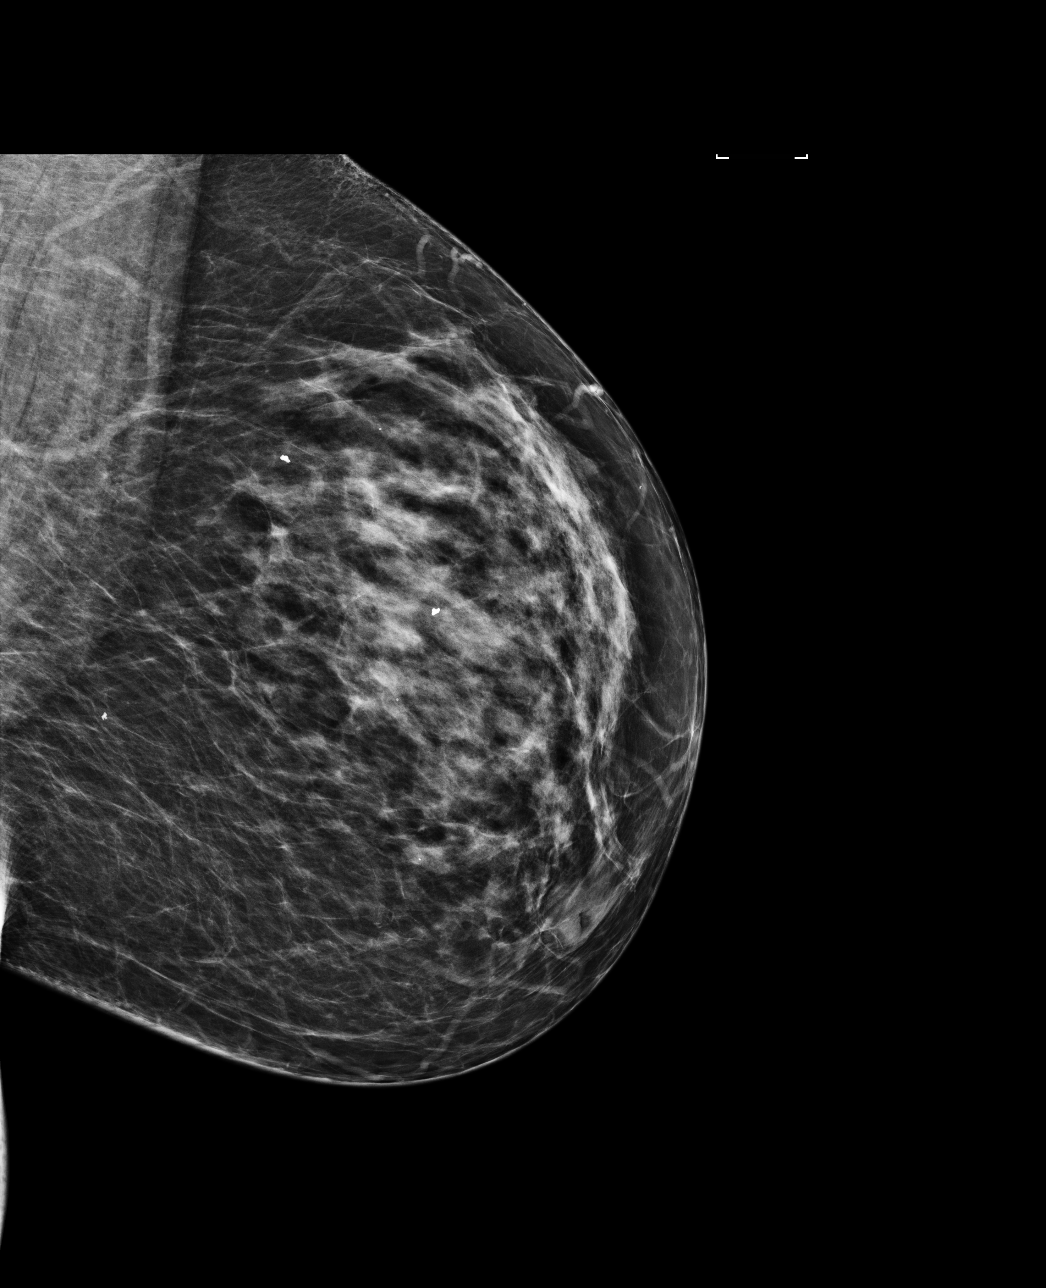

[4 of 4 positions shown; findings below may reference images not displayed]

ACR Breast Density Category c: The breast tissue is heterogeneously
dense, which may obscure small masses.
FINDINGS: There are no findings suspicious for malignancy. Images were
processed with CAD.
IMPRESSION: No mammographic evidence of malignancy. A result letter of this
screening mammogram will be mailed directly to the patient.

RECOMMENDATION:
Screening mammogram in one year. (Code:[id number])

BI-RADS CATEGORY  1: Negative.

## 2020-07-13 ENCOUNTER — Other Ambulatory Visit: Payer: Self-pay | Admitting: Internal Medicine

## 2020-07-13 DIAGNOSIS — Z1231 Encounter for screening mammogram for malignant neoplasm of breast: Secondary | ICD-10-CM

## 2020-09-05 ENCOUNTER — Other Ambulatory Visit: Payer: Self-pay

## 2020-09-05 ENCOUNTER — Ambulatory Visit
Admission: RE | Admit: 2020-09-05 | Discharge: 2020-09-05 | Disposition: A | Discharge status: Home / Self Care | Payer: 59 | Source: Ambulatory Visit | Attending: Internal Medicine | Admitting: Internal Medicine

## 2020-09-05 DIAGNOSIS — Z1231 Encounter for screening mammogram for malignant neoplasm of breast: Secondary | ICD-10-CM

## 2021-05-23 ENCOUNTER — Encounter: Payer: Self-pay | Admitting: Family Medicine

## 2021-08-14 ENCOUNTER — Other Ambulatory Visit: Payer: Self-pay | Admitting: Internal Medicine

## 2021-08-14 DIAGNOSIS — Z1231 Encounter for screening mammogram for malignant neoplasm of breast: Secondary | ICD-10-CM

## 2021-09-06 ENCOUNTER — Ambulatory Visit
Admission: RE | Admit: 2021-09-06 | Discharge: 2021-09-06 | Disposition: A | Discharge status: Home / Self Care | Payer: 59 | Source: Ambulatory Visit | Attending: Internal Medicine | Admitting: Internal Medicine

## 2021-09-06 ENCOUNTER — Ambulatory Visit: Payer: 59

## 2021-09-06 DIAGNOSIS — Z1231 Encounter for screening mammogram for malignant neoplasm of breast: Secondary | ICD-10-CM

## 2022-09-02 ENCOUNTER — Other Ambulatory Visit: Payer: Self-pay | Admitting: Internal Medicine

## 2022-09-02 DIAGNOSIS — Z1231 Encounter for screening mammogram for malignant neoplasm of breast: Secondary | ICD-10-CM

## 2022-10-03 ENCOUNTER — Ambulatory Visit
Admission: RE | Admit: 2022-10-03 | Discharge: 2022-10-03 | Disposition: A | Discharge status: Home / Self Care | Payer: 59 | Source: Ambulatory Visit | Attending: Internal Medicine | Admitting: Internal Medicine

## 2022-10-03 DIAGNOSIS — Z1231 Encounter for screening mammogram for malignant neoplasm of breast: Secondary | ICD-10-CM

## 2022-11-01 ENCOUNTER — Other Ambulatory Visit: Payer: Self-pay | Admitting: Nurse Practitioner

## 2022-11-01 DIAGNOSIS — Z780 Asymptomatic menopausal state: Secondary | ICD-10-CM

## 2022-11-01 DIAGNOSIS — E2839 Other primary ovarian failure: Secondary | ICD-10-CM

## 2022-12-25 ENCOUNTER — Other Ambulatory Visit: Payer: 59

## 2023-03-13 ENCOUNTER — Other Ambulatory Visit: Payer: 59

## 2023-09-22 ENCOUNTER — Other Ambulatory Visit: Payer: Self-pay | Admitting: Internal Medicine

## 2023-09-22 DIAGNOSIS — Z1231 Encounter for screening mammogram for malignant neoplasm of breast: Secondary | ICD-10-CM

## 2023-10-07 ENCOUNTER — Ambulatory Visit
Admission: RE | Admit: 2023-10-07 | Discharge: 2023-10-07 | Disposition: A | Discharge status: Home / Self Care | Payer: Self-pay | Source: Ambulatory Visit | Attending: Internal Medicine | Admitting: Internal Medicine

## 2023-10-07 DIAGNOSIS — Z1231 Encounter for screening mammogram for malignant neoplasm of breast: Secondary | ICD-10-CM

## 2023-10-23 ENCOUNTER — Other Ambulatory Visit: Payer: 59

## 2023-11-27 ENCOUNTER — Encounter: Payer: Self-pay | Admitting: Family Medicine

## 2023-11-27 ENCOUNTER — Ambulatory Visit (INDEPENDENT_AMBULATORY_CARE_PROVIDER_SITE_OTHER): Payer: Self-pay | Admitting: Family Medicine

## 2023-11-27 VITALS — BP 142/80 | Ht 61.5 in | Wt 169.0 lb

## 2023-11-27 DIAGNOSIS — M8580 Other specified disorders of bone density and structure, unspecified site: Secondary | ICD-10-CM | POA: Diagnosis not present

## 2023-11-27 NOTE — Progress Notes (Addendum)
 PCP: Ransom Other, MD  Subjective:   HPI: Michelle Oneill is a 68 y.o. female here for osteopenia evaluation and treatment.   Michelle Oneill says she has a history of osteopenia with previous DEXA in 2020. Since then she has been taking vitamin D and calcium  consistently.  Recently had another DEXA at [facility] and was told she had osteopenia and to continue taking vitamin D and calcium .   Prior treatment: vitamin D and calcium  History of Hip, Spine, or Wrist Fracture: none Heart disease or stroke: no Cancer: no Kidney Disease: no Gastric/Peptic Ulcer: no Gastric bypass surgery: no Severe GERD: no History of seizures: no Age at Menopause: 42 Hysterectomy: s/p total abd hysterectomy 2002  Calcium  intake: 500 mg daily  Vitamin D intake: 400 mg daily  Hormone replacement therapy: currently on estradiol   Smoking history: never  Alcohol: no  Exercise: recently bought a stationary bike and just joined a gym  Major dental work in past year: no Parents with hip/spine fracture: no    Past Medical History:  Diagnosis Date   Anxiety    Arthritis    Asthma    Back pain    Chest pain 2011   stress test neg   DM (diabetes mellitus) (HCC)    diet controlled   Dyslipidemia    Family history of adverse reaction to anesthesia    sisters also get nauseated    Heart palpitations    History of bronchitis    History of pneumonia    HTN (hypertension)    Osteopenia    PONV (postoperative nausea and vomiting)    very sick   Postmenopausal    Seasonal allergies    Shoulder pain 2009   s/p surgery    Current Outpatient Medications on File Prior to Visit  Medication Sig Dispense Refill   ALPRAZolam  (XANAX ) 0.5 MG tablet Take 0.5 mg by mouth daily as needed for anxiety.      Ascorbic Acid (VITAMIN C) 1000 MG tablet Take 1,000 mg by mouth at bedtime.     aspirin EC 81 MG tablet Take 81 mg by mouth daily.     Calcium  Carb-Cholecalciferol  (CALCIUM  500+D3) 500-400 MG-UNIT TABS Take 1 tablet by  mouth at bedtime.     estradiol  (ESTRACE ) 1 MG tablet Take 1 mg by mouth daily with lunch.  11   Pseudoephedrine-Guaifenesin (MUCINEX D PO) Take 1 tablet by mouth daily as needed (congestion).     rosuvastatin  (CRESTOR ) 10 MG tablet Take 10 mg by mouth at bedtime.      sertraline  (ZOLOFT ) 50 MG tablet Take 50 mg by mouth at bedtime.      triamcinolone cream (KENALOG) 0.1 % Apply 1 application topically 2 (two) times daily as needed (eczema).     valsartan (DIOVAN) 160 MG tablet Take 160 mg by mouth daily.     vitamin E  400 UNIT capsule Take 400 Units by mouth at bedtime.     No current facility-administered medications on file prior to visit.    Past Surgical History:  Procedure Laterality Date   ABDOMINAL HYSTERECTOMY  2001   due to fibroids   ANTERIOR LAT LUMBAR FUSION Left 03/16/2015   Procedure: ANTERIOR LATERAL LUMBAR FUSION 1 LEVEL;  Surgeon: Oneil Priestly, MD;  Location: [facility] OR;  Service: Orthopedics;  Laterality: Left;  Left sided lumbar 4-5 lateral lumbar interbody fusion with instrumentation and allograft   back pain     bilateral bunionectomy     CESAREAN SECTION     x2  ezema     HAMMER TOE SURGERY Right    menorrhagia     osteopenia     postmenopausal syndrome     ROTATOR CUFF REPAIR Left     Allergies  Allergen Reactions   Lotrel [Amlodipine Besy-Benazepril Hcl] Other (See Comments)    Scalp, hair problem     BP (!) 142/80   Ht 5' 1.5 (1.562 m)   Wt 169 lb (76.7 kg)   BMI 31.42 kg/m       No data to display              No data to display              Objective:  Physical Exam:  Gen: NAD, comfortable in exam room MSK: Seated comfortably in chair, able to easily transfer between chair and exam table and back, normal gross strength of upper and lower extremities, normal gait  Psyc: pleasant, good insight and judgement   IMAGING: DEXA 11/11/23 showing: T-score -1.5 at the right femoral neck FRAX 10-year probability of major osteoporotic  fracture is 8% and hip fracture is 0.7% Impression: - Based on WHO criteria the Michelle Oneill has osteopenia at multiple sites - Follow-up bone density study in 2 years recommended  DEXA 02/19/2019 showing: ASSESSMENT: The BMD measured at Femur Neck Left is 0.791 g/cm2 with a T-score of -1.8. This Michelle Oneill is considered osteopenic according to World Health Organization [facility]) criteria.   The scan quality is good. L-3 and L-4 were excluded due to degenerative changes.   Site Region Measured Date Measured Age YA BMD Significant CHANGE T-score   DualFemur Neck Left  02/19/2019    61.0         -1.8    0.791 g/cm2   AP Spine  L1-L2      02/19/2019    61.0         -1.6    0.975 g/cm2   DualFemur Total Mean 02/19/2019    61.0         -1.3    0.850 g/cm2   Assessment & Plan:   Assessment & Plan Osteopenia, unspecified location Michelle Oneill with osteopenia noted on bone density scan in 2025 in 2020.  Most recent T-score -1.5 on DEXA July 2025  - No prior history of fragility fractures - Already taking calcium  and vitamin D   PLAN: - Visit notes with PCP from 11/13/2023 reviewed in detail - Labs with PCP reviewed showing: Normal vitamin D 77 on 07/25/2023, normal TSH 0.82 on 07/25/2023, normal CMP with normal calcium  07/25/2023, normal CBC 07/25/2023 -  Continue vitamin D and calcium  supplementation as she is doing - Counseled Michelle Oneill on increasing resistance based strength exercises to help improve bone mass - Can follow-up with us  in 1 to 2 years for reevaluation.  Plan repeat DEXA in approximately 2 years
